# Patient Record
Sex: Female | Born: 1980 | Race: White | Hispanic: No | Marital: Married
Health system: Southern US, Community
[De-identification: ages and names within clinical notes are randomized; demographics above are authoritative.]

## PROBLEM LIST (undated history)

## (undated) DIAGNOSIS — F32A Depression, unspecified: Secondary | ICD-10-CM

## (undated) DIAGNOSIS — K219 Gastro-esophageal reflux disease without esophagitis: Secondary | ICD-10-CM

## (undated) DIAGNOSIS — T7840XA Allergy, unspecified, initial encounter: Secondary | ICD-10-CM

## (undated) DIAGNOSIS — F419 Anxiety disorder, unspecified: Secondary | ICD-10-CM

## (undated) DIAGNOSIS — D649 Anemia, unspecified: Secondary | ICD-10-CM

## (undated) DIAGNOSIS — M1990 Unspecified osteoarthritis, unspecified site: Secondary | ICD-10-CM

## (undated) HISTORY — DX: Depression, unspecified: F32.A

## (undated) HISTORY — DX: Allergy, unspecified, initial encounter: T78.40XA

## (undated) HISTORY — DX: Anxiety disorder, unspecified: F41.9

---

## 2001-11-19 ENCOUNTER — Encounter: Payer: Self-pay | Admitting: Orthopedic Surgery

## 2001-11-19 ENCOUNTER — Encounter: Admission: RE | Admit: 2001-11-19 | Discharge: 2001-11-19 | Payer: Self-pay | Admitting: Orthopedic Surgery

## 2004-08-06 ENCOUNTER — Emergency Department (HOSPITAL_COMMUNITY): Admission: EM | Admit: 2004-08-06 | Discharge: 2004-08-06 | Payer: Self-pay | Admitting: Family Medicine

## 2004-10-15 ENCOUNTER — Emergency Department (HOSPITAL_COMMUNITY): Admission: EM | Admit: 2004-10-15 | Discharge: 2004-10-15 | Payer: Self-pay | Admitting: Family Medicine

## 2004-12-24 ENCOUNTER — Emergency Department (HOSPITAL_COMMUNITY): Admission: EM | Admit: 2004-12-24 | Discharge: 2004-12-24 | Payer: Self-pay | Admitting: Family Medicine

## 2004-12-27 ENCOUNTER — Emergency Department (HOSPITAL_COMMUNITY): Admission: EM | Admit: 2004-12-27 | Discharge: 2004-12-27 | Payer: Self-pay | Admitting: Family Medicine

## 2005-03-20 ENCOUNTER — Emergency Department (HOSPITAL_COMMUNITY): Admission: EM | Admit: 2005-03-20 | Discharge: 2005-03-20 | Payer: Self-pay | Admitting: Emergency Medicine

## 2005-03-23 ENCOUNTER — Emergency Department (HOSPITAL_COMMUNITY): Admission: EM | Admit: 2005-03-23 | Discharge: 2005-03-23 | Payer: Self-pay | Admitting: Emergency Medicine

## 2005-03-28 ENCOUNTER — Emergency Department (HOSPITAL_COMMUNITY): Admission: EM | Admit: 2005-03-28 | Discharge: 2005-03-28 | Payer: Self-pay | Admitting: Emergency Medicine

## 2005-04-04 ENCOUNTER — Emergency Department (HOSPITAL_COMMUNITY): Admission: EM | Admit: 2005-04-04 | Discharge: 2005-04-04 | Payer: Self-pay | Admitting: Emergency Medicine

## 2005-04-18 ENCOUNTER — Emergency Department (HOSPITAL_COMMUNITY): Admission: EM | Admit: 2005-04-18 | Discharge: 2005-04-18 | Payer: Self-pay | Admitting: Emergency Medicine

## 2005-09-09 ENCOUNTER — Emergency Department (HOSPITAL_COMMUNITY): Admission: EM | Admit: 2005-09-09 | Discharge: 2005-09-09 | Payer: Self-pay | Admitting: Family Medicine

## 2005-10-24 ENCOUNTER — Emergency Department (HOSPITAL_COMMUNITY): Admission: EM | Admit: 2005-10-24 | Discharge: 2005-10-24 | Payer: Self-pay | Admitting: Emergency Medicine

## 2005-11-07 ENCOUNTER — Ambulatory Visit (HOSPITAL_COMMUNITY): Admission: RE | Admit: 2005-11-07 | Discharge: 2005-11-07 | Payer: Self-pay | Admitting: Urology

## 2006-01-25 ENCOUNTER — Emergency Department (HOSPITAL_COMMUNITY): Admission: EM | Admit: 2006-01-25 | Discharge: 2006-01-25 | Payer: Self-pay | Admitting: Emergency Medicine

## 2006-05-20 ENCOUNTER — Inpatient Hospital Stay (HOSPITAL_COMMUNITY): Admission: AD | Admit: 2006-05-20 | Discharge: 2006-05-20 | Payer: Self-pay | Admitting: Obstetrics and Gynecology

## 2006-06-20 ENCOUNTER — Ambulatory Visit (HOSPITAL_COMMUNITY): Admission: RE | Admit: 2006-06-20 | Discharge: 2006-06-20 | Payer: Self-pay | Admitting: Obstetrics

## 2006-11-20 ENCOUNTER — Inpatient Hospital Stay (HOSPITAL_COMMUNITY): Admission: AD | Admit: 2006-11-20 | Discharge: 2006-11-24 | Payer: Self-pay | Admitting: Obstetrics

## 2008-08-13 ENCOUNTER — Emergency Department: Payer: Self-pay | Admitting: Internal Medicine

## 2008-08-18 ENCOUNTER — Emergency Department: Payer: Self-pay | Admitting: Emergency Medicine

## 2008-08-20 ENCOUNTER — Inpatient Hospital Stay: Payer: Self-pay | Admitting: Internal Medicine

## 2009-09-26 ENCOUNTER — Emergency Department: Payer: Self-pay | Admitting: Emergency Medicine

## 2009-12-12 ENCOUNTER — Emergency Department: Payer: Self-pay | Admitting: Emergency Medicine

## 2010-08-13 ENCOUNTER — Emergency Department: Payer: Self-pay | Admitting: Emergency Medicine

## 2010-12-15 ENCOUNTER — Emergency Department: Payer: Self-pay | Admitting: Emergency Medicine

## 2010-12-24 ENCOUNTER — Emergency Department: Payer: Self-pay | Admitting: Internal Medicine

## 2010-12-25 ENCOUNTER — Emergency Department: Payer: Self-pay | Admitting: Emergency Medicine

## 2010-12-31 ENCOUNTER — Ambulatory Visit: Payer: Self-pay | Admitting: Urology

## 2011-01-03 ENCOUNTER — Emergency Department: Payer: Self-pay | Admitting: Internal Medicine

## 2011-05-21 ENCOUNTER — Emergency Department: Payer: Self-pay | Admitting: Emergency Medicine

## 2011-07-09 ENCOUNTER — Emergency Department: Payer: Self-pay | Admitting: Emergency Medicine

## 2012-01-22 ENCOUNTER — Emergency Department: Payer: Self-pay | Admitting: Emergency Medicine

## 2012-03-31 ENCOUNTER — Emergency Department: Payer: Self-pay | Admitting: Emergency Medicine

## 2012-03-31 LAB — URINALYSIS, COMPLETE
Bilirubin,UR: NEGATIVE
Glucose,UR: NEGATIVE mg/dL (ref 0–75)
Ketone: NEGATIVE
RBC,UR: 5 /HPF (ref 0–5)

## 2012-03-31 LAB — CBC
HGB: 13.1 g/dL (ref 12.0–16.0)
Platelet: 248 10*3/uL (ref 150–440)
RBC: 4.61 10*6/uL (ref 3.80–5.20)

## 2012-04-02 ENCOUNTER — Other Ambulatory Visit: Payer: Self-pay | Admitting: Obstetrics and Gynecology

## 2012-04-02 LAB — HCG, QUANTITATIVE, PREGNANCY: Beta Hcg, Quant.: 7 m[IU]/mL — ABNORMAL HIGH

## 2012-05-01 ENCOUNTER — Emergency Department: Payer: Self-pay | Admitting: Emergency Medicine

## 2012-05-01 LAB — URINALYSIS, COMPLETE
Bilirubin,UR: NEGATIVE
Blood: NEGATIVE
Glucose,UR: NEGATIVE mg/dL (ref 0–75)
Leukocyte Esterase: NEGATIVE
Squamous Epithelial: 8
WBC UR: 3 /HPF (ref 0–5)

## 2012-05-01 LAB — CBC
HCT: 40.4 % (ref 35.0–47.0)
Platelet: 255 10*3/uL (ref 150–440)
RDW: 13.1 % (ref 11.5–14.5)
WBC: 11.7 10*3/uL — ABNORMAL HIGH (ref 3.6–11.0)

## 2012-05-01 LAB — HCG, QUANTITATIVE, PREGNANCY: Beta Hcg, Quant.: 307 m[IU]/mL — ABNORMAL HIGH

## 2012-06-01 ENCOUNTER — Emergency Department: Payer: Self-pay | Admitting: Emergency Medicine

## 2012-06-01 ENCOUNTER — Ambulatory Visit: Payer: Self-pay | Admitting: Family Medicine

## 2012-06-01 LAB — BASIC METABOLIC PANEL
Anion Gap: 7 (ref 7–16)
BUN: 10 mg/dL (ref 7–18)
Chloride: 95 mmol/L — ABNORMAL LOW (ref 98–107)
Co2: 33 mmol/L — ABNORMAL HIGH (ref 21–32)
Creatinine: 0.6 mg/dL (ref 0.60–1.30)
Glucose: 131 mg/dL — ABNORMAL HIGH (ref 65–99)
Potassium: 3.3 mmol/L — ABNORMAL LOW (ref 3.5–5.1)

## 2012-06-01 LAB — CBC
MCH: 28.2 pg (ref 26.0–34.0)
MCHC: 32.9 g/dL (ref 32.0–36.0)
MCV: 86 fL (ref 80–100)
Platelet: 258 10*3/uL (ref 150–440)
WBC: 12.3 10*3/uL — ABNORMAL HIGH (ref 3.6–11.0)

## 2012-06-01 LAB — HCG, QUANTITATIVE, PREGNANCY: Beta Hcg, Quant.: 31724 m[IU]/mL — ABNORMAL HIGH

## 2012-07-02 ENCOUNTER — Encounter: Payer: Self-pay | Admitting: Obstetrics and Gynecology

## 2012-08-06 ENCOUNTER — Encounter: Payer: Self-pay | Admitting: Maternal and Fetal Medicine

## 2012-09-15 ENCOUNTER — Emergency Department: Payer: Self-pay | Admitting: Emergency Medicine

## 2012-09-16 LAB — COMPREHENSIVE METABOLIC PANEL
Anion Gap: 8 (ref 7–16)
Calcium, Total: 9.7 mg/dL (ref 8.5–10.1)
Chloride: 98 mmol/L (ref 98–107)
EGFR (African American): >60
Potassium: 3.5 mmol/L (ref 3.5–5.1)
SGOT(AST): 18 U/L (ref 15–37)

## 2012-09-16 LAB — URINALYSIS, COMPLETE
Glucose,UR: NEGATIVE mg/dL (ref 0–75)
Nitrite: NEGATIVE
RBC,UR: 32 /HPF (ref 0–5)
Squamous Epithelial: 7

## 2012-09-16 LAB — CBC
RBC: 4.46 10*6/uL (ref 3.80–5.20)
RDW: 13.5 % (ref 11.5–14.5)
WBC: 14.6 10*3/uL — ABNORMAL HIGH (ref 3.6–11.0)

## 2012-09-16 LAB — HCG, QUANTITATIVE, PREGNANCY: Beta Hcg, Quant.: 10824 m[IU]/mL — ABNORMAL HIGH

## 2012-09-20 LAB — URINE CULTURE

## 2013-01-06 ENCOUNTER — Inpatient Hospital Stay: Payer: Self-pay

## 2013-01-06 LAB — PIH PROFILE
Chloride: 101 mmol/L (ref 98–107)
Co2: 31 mmol/L (ref 21–32)
Creatinine: 0.64 mg/dL (ref 0.60–1.30)
Glucose: 107 mg/dL — ABNORMAL HIGH (ref 65–99)
HCT: 33.8 % — ABNORMAL LOW (ref 35.0–47.0)
MCH: 26.9 pg (ref 26.0–34.0)
MCV: 84 fL (ref 80–100)
Osmolality: 275 (ref 275–301)
SGOT(AST): 20 U/L (ref 15–37)
Uric Acid: 4 mg/dL (ref 2.6–6.0)
WBC: 12 10*3/uL — ABNORMAL HIGH (ref 3.6–11.0)

## 2013-03-18 ENCOUNTER — Emergency Department: Payer: Self-pay | Admitting: Emergency Medicine

## 2013-09-21 ENCOUNTER — Emergency Department: Payer: Self-pay | Admitting: Emergency Medicine

## 2013-10-30 ENCOUNTER — Emergency Department: Payer: Self-pay | Admitting: Emergency Medicine

## 2013-10-30 LAB — RAPID INFLUENZA A&B ANTIGENS

## 2013-11-02 ENCOUNTER — Emergency Department: Payer: Self-pay | Admitting: Emergency Medicine

## 2013-11-02 LAB — URINALYSIS, COMPLETE
Bacteria: NONE SEEN
Glucose,UR: NEGATIVE mg/dL (ref 0–75)
Nitrite: NEGATIVE
Ph: 6 (ref 4.5–8.0)
SPECIFIC GRAVITY: 1.03 (ref 1.003–1.030)

## 2013-11-02 LAB — PREGNANCY, URINE: PREGNANCY TEST, URINE: NEGATIVE m[IU]/mL

## 2014-05-16 ENCOUNTER — Emergency Department: Payer: Self-pay | Admitting: Emergency Medicine

## 2014-05-16 LAB — COMPREHENSIVE METABOLIC PANEL
ALK PHOS: 88 U/L
ALT: 26 U/L (ref 12–78)
AST: 19 U/L (ref 15–37)
Albumin: 3.4 g/dL (ref 3.4–5.0)
Anion Gap: 5 — ABNORMAL LOW (ref 7–16)
BILIRUBIN TOTAL: 0.5 mg/dL (ref 0.2–1.0)
BUN: 10 mg/dL (ref 7–18)
CHLORIDE: 99 mmol/L (ref 98–107)
CREATININE: 0.64 mg/dL (ref 0.60–1.30)
Calcium, Total: 8.5 mg/dL (ref 8.5–10.1)
Co2: 34 mmol/L — ABNORMAL HIGH (ref 21–32)
EGFR (African American): >60
GLUCOSE: 85 mg/dL (ref 65–99)
Osmolality: 274 (ref 275–301)
Potassium: 3.7 mmol/L (ref 3.5–5.1)
SODIUM: 138 mmol/L (ref 136–145)
TOTAL PROTEIN: 7.8 g/dL (ref 6.4–8.2)

## 2014-05-16 LAB — URINALYSIS, COMPLETE
Bilirubin,UR: NEGATIVE
Blood: NEGATIVE
Glucose,UR: NEGATIVE mg/dL (ref 0–75)
Ketone: NEGATIVE
Leukocyte Esterase: NEGATIVE
Nitrite: NEGATIVE
Ph: 7 (ref 4.5–8.0)
Protein: 30
RBC,UR: 3 /HPF (ref 0–5)
Specific Gravity: 1.026 (ref 1.003–1.030)
Squamous Epithelial: 8
WBC UR: 5 /HPF (ref 0–5)

## 2014-05-16 LAB — CBC
HCT: 39.8 % (ref 35.0–47.0)
HGB: 12.7 g/dL (ref 12.0–16.0)
MCH: 26.9 pg (ref 26.0–34.0)
MCHC: 32 g/dL (ref 32.0–36.0)
MCV: 84 fL (ref 80–100)
PLATELETS: 238 10*3/uL (ref 150–440)
RBC: 4.73 10*6/uL (ref 3.80–5.20)
RDW: 14 % (ref 11.5–14.5)
WBC: 9.4 10*3/uL (ref 3.6–11.0)

## 2014-05-16 LAB — LIPASE, BLOOD: LIPASE: 99 U/L (ref 73–393)

## 2014-06-18 ENCOUNTER — Emergency Department: Payer: Self-pay | Admitting: Emergency Medicine

## 2014-08-18 ENCOUNTER — Emergency Department: Payer: Self-pay | Admitting: Emergency Medicine

## 2014-12-21 ENCOUNTER — Emergency Department: Payer: Self-pay | Admitting: Emergency Medicine

## 2015-02-14 NOTE — Consult Note (Signed)
Referral Information:   Reason for Referral elevated BP and h/o hypertension with last delivery    Referring Physician [facility] - Shapley-Quinn    Prenatal Hx 34 yo G3 p1011 at 66 w 6d with EDC of 3/14 by 8 3/7 w scan done at [facility]  -early bleeding now resolved -elevated BP a t [facility] 130/92 130/88, pt ntoes she had elevated BP after last dleivery that she relates to her mother inlaw  -h/o LGA infant  - h/o UTI, nephrolithiasis -varicella equivocal    Past Obstetrical Hx 2008 - 40 w female 9lb 10 oz - episiotomy no problems [facility] [place] elevated BP pp that pt attributes to family stress  03/2012 - early SAB 6 weeks   Home Medications: Medication Instructions Status  Prenatal Multivitamins oral tablet 1 tab(s) orally once a day Active   Allergies:   Eggs: Hives  Vital Signs/Notes:  Nursing Vital Signs: **Vital Signs.:   05-Sep-13 08:33   Vital Signs Type Routine   Temperature Temperature (F) 99.1   Celsius 37.2   Temperature Source oral   Pulse Pulse 88   Pulse source if not from Vital Sign Device dinamap   Respirations Respirations 14   Systolic BP Systolic BP 112   Diastolic BP (mmHg) Diastolic BP (mmHg) 71   Mean BP 84   BP Source  if not from Vital Sign Device dinamap   Perinatal Consult:   LMP 29-Mar-2012    PMed Hx Rubella Immune, varicella equivocal    Past Medical History cont'd - h/o UTI -h/o nephrolithiasis  - occ elevated BP no meds , no preeclampsia with alst delivery though elevated BP noted pp  - elevated BMI occurred with last pregnancy gained 50-60 lbs BMI  - husband smokes recently quit to be able to afford a new motorcycle    FHx metabolic disorders either side, fam h/o diabetes    Soc Hx married   Review Of Systems:   Tolerating Diet Yes    Medications/Allergies Reviewed Medications/Allergies reviewed   Exam:   Today's Weight 224   Impression/Recommendations:   Impression 1IUP at 12w 6d  Normal first tri scan today genetic  counsleing declines screening for aneuploidy, bleeding resolved  2sporadically increased BP that pt attributes to stress - normal today  3varicella equivocal 4h/o LGA infant -family h/o diabetes - GCT done last visit at [facility] no results yet  5h/o UTI and nephrolithiasis  6 Elevated BMI    Recommendations 1. Anatomy scan ordered for 18 weeks 2 encouraged limiting weight gain to 10-15 pounds  3 agree with early GCT repeat 28 weeks 4 consider third tri scan to evaluate EFW  5 q tri UC&S  6 varicella vaccine pp   Plan:   Genetic Counseling done - note to follow    Prenatal Diagnosis Options declined all but u/s    Ultrasound at what gestational ages 30 and 39 weeks    Delivery Mode Vaginal    Additional Testing p/c ratio     Total Time Spent with Patient 30 minutes    >50% of visit spent in couseling/coordination of care yes    Office Use Only [address]  Level 2 ( ) NEW office consult exp prob focused   Coding Description: MATERNAL CONDITIONS/HISTORY INDICATION(S).   Chronic HTN.   History of prior preg w/ Macrosomia.  Electronic Signatures: Rondall Allegra (MD)  (Signed 05-Sep-13 12:01)  Authored: Referral, Home Medications, Allergies, Vital Signs/Notes, Consult, Exam, Impression, Plan, Billing, Coding Description   Last  Updated: 05-Sep-13 12:01 by Rondall AllegraLivingston, Lavonia Eager [place] (MD)

## 2015-02-17 NOTE — H&P (Signed)
PATIENT NAME:  Karen Harrington, Karen Harrington MR#:  [id number] DATE OF BIRTH:  10-08-1981  DATE OF ADMISSION:  01/06/2013  DIAGNOSES:  1.  A 39-1/5 week intrauterine pregnancy.  2.  Mild pregnancy-induced hypertension.   HISTORY OF PRESENT ILLNESS:  The patient is a 34 year old white female gravida 2, para 1-0-0-1, EDC 01/08/2013, presents at 39.[redacted] weeks gestation for induction of labor secondary to pregnancy-induced hypertension.  The patient has been having as part of testing with reassuring NSTs.  The patient reports good fetal movement.  She denies preeclampsia symptoms.   PAST MEDICAL HISTORY: 1.  Increased body mass index.  2.  Renal lithiasis.  3.  Pregnancy-induced hypertension.   PAST SURGICAL HISTORY:  Lithotripsy.   PAST OBSTETRICAL HISTORY:  Para 1-0-0-1, spontaneous vaginal delivery x 1 with delivery of a 9 pound 10 ounce baby.   FAMILY HISTORY:  Diabetes mellitus in mother and sister.  No history of colon, ovary or breast cancer.  There are no genetic disorders in the family.   SOCIAL HISTORY:  The patient is a previous smoker; quit 4 years ago.  Alcohol use negative.  Drug use negative.   DRUG ALLERGIES:  None.   CURRENT MEDICATIONS:  Prenatal vitamins, Tylenol.   REVIEW OF SYSTEMS:  The patient denies preeclampsia symptoms.  She reports good fetal movement.   PHYSICAL EXAMINATION:  VITAL SIGNS:  Weight 238, blood pressure 115/88, heart rate 96, BMI 37.3.  GENERAL:  The patient is a pleasant well-appearing gravid female in no acute distress.  She is alert and oriented.  NECK:  Supple without thyromegaly or adenopathy.  LUNGS:  Clear.  HEART:  Regular rate and rhythm without murmur.  ABDOMEN:  Soft, gravid, fundal height 36 cm, vertex by Thayer OhmLeopold.  Estimated fetal weight 8 pounds, 8 ounces.  Fetal heart tones are normal at 145.  Cervix is to 70%, minus 2, vertex, bag of water intact.  EXTREMITIES:  1+ edema.  Deep tendon reflexes 2+.   IMAGING STUDIES:  Ultrasound today, AFI 8.5 cm.   BPP is 8 out of 8.   ASSESSMENT:   1.  A 39.5 week intrauterine pregnancy, undelivered.  2.  Mild pregnancy-induced hypertension.   PLAN:  1.  Cytotec/Pitocin induction of labor.  2.  Pregnancy-induced hypertension panel.  3.  Epidural as needed.  4.  Anticipate vaginal delivery.     ____________________________ Prentice DockerMartin A. DeFrancesco, MD mad:ea D: 01/06/2013 22:47:05 ET T: 01/06/2013 23:02:19 ET JOB#: [id number]  cc: Daphine DeutscherMartin A. DeFrancesco, MD, <Dictator> [facility] Prentice DockerMARTIN A DEFRANCESCO MD ELECTRONICALLY SIGNED 01/10/2013 17:40

## 2015-05-27 ENCOUNTER — Emergency Department
Admission: EM | Admit: 2015-05-27 | Discharge: 2015-05-27 | Disposition: A | Discharge status: Home / Self Care | Payer: Medicaid Other | Attending: Emergency Medicine | Admitting: Emergency Medicine

## 2015-05-27 ENCOUNTER — Emergency Department: Payer: Self-pay

## 2015-05-27 ENCOUNTER — Encounter: Payer: Self-pay | Admitting: Emergency Medicine

## 2015-05-27 DIAGNOSIS — Z87891 Personal history of nicotine dependence: Secondary | ICD-10-CM | POA: Insufficient documentation

## 2015-05-27 DIAGNOSIS — L309 Dermatitis, unspecified: Secondary | ICD-10-CM

## 2015-05-27 DIAGNOSIS — M722 Plantar fascial fibromatosis: Secondary | ICD-10-CM

## 2015-05-27 MED ORDER — ETODOLAC 400 MG PO TABS: 400.0000 mg | ORAL_TABLET | Freq: Two times a day (BID) | ORAL | Status: DC

## 2015-05-27 MED ORDER — CLINDAMYCIN HCL 150 MG PO CAPS: 300.0000 mg | ORAL_CAPSULE | Freq: Once | ORAL | Status: DC

## 2015-05-27 NOTE — ED Provider Notes (Signed)
[facility] Emergency Department Provider Note  ____________________________________________  Time seen:  1:06 PM  I have reviewed the triage vital signs and the nursing notes.   HISTORY  Chief Complaint Foot Pain   HPI Karen Harrington is a 34 y.o. female is here today with complaint of bilateral feet pain for greater then several weeks. She states she wakes up with pain that increases when she initially gets up in the mornings. This pain continues throughout the day. She has been taking Tylenol and ibuprofen sparingly without any relief of her pain. She denies any previous foot pain or injury. She also has a rash to her right lower leg that she was checked. She states rash has been there for "a while" and doesn't itch or burn. She states it feels dry and bumpy. Currently her foot pain is 10 over 10.   History reviewed. No pertinent past medical history.  There are no active problems to display for this patient.   History reviewed. No pertinent past surgical history.  Current Outpatient Rx  Name  Route  Sig  Dispense  Refill  . etodolac (LODINE) 400 MG tablet   Oral   Take 1 tablet (400 mg total) by mouth 2 (two) times daily.   20 tablet   0     Allergies Review of patient's allergies indicates no known allergies.  No family history on file.  Social History History  Substance Use Topics  . Smoking status: Former Games developer  . Smokeless tobacco: Not on file  . Alcohol Use: No    Review of Systems Constitutional: No fever/chills ENT: No sore throat. Cardiovascular: Denies chest pain. Respiratory: Denies shortness of breath. Gastrointestinal: No abdominal pain.  No nausea, no vomiting. Genitourinary: Negative for dysuria. Musculoskeletal: Negative for back pain. Skin: Positive for rash. Neurological: Negative for headaches, focal weakness or numbness.  10-point ROS otherwise  negative.  ____________________________________________   PHYSICAL EXAM:  VITAL SIGNS: ED Triage Vitals  Enc Vitals Group     BP 05/27/15 1230 111/70 mmHg     Pulse Rate 05/27/15 1230 91     Resp 05/27/15 1230 18     Temp 05/27/15 1230 98.5 F (36.9 C)     Temp Source 05/27/15 1230 Oral     SpO2 05/27/15 1230 96 %     Weight 05/27/15 1224 245 lb (111.131 kg)     Height 05/27/15 1224  (1.676 m)     Head Cir --      Peak Flow --      Pain Score 05/27/15 1224 10     Pain Loc --      Pain Edu? --      Excl. in GC? --     Constitutional: Alert and oriented. Well appearing and in no acute distress. Eyes: Conjunctivae are normal. PERRL. EOMI. Head: Atraumatic. Nose: No congestion/rhinnorhea. Neck: No stridor.   Cardiovascular: Normal rate, regular rhythm. Grossly normal heart sounds.  Good peripheral circulation. Respiratory: Normal respiratory effort.  No retractions. Lungs CTAB. Gastrointestinal: Soft and nontender. No distention Musculoskeletal: Bilateral feet without deformity. Patient has poor bilateral arches. There is marked tenderness on palpation plantar aspect of the left heel and arch of the foot. There is plantar pain in the right foot as well. Digits move without difficulty. Motor sensory function intact. Neurologic:  Normal speech and language. No gross focal neurologic deficits are appreciated. No gait instability. Skin: Skin is warm. Skin bilateral feet there is no erythema or  edema noted. Right lower leg there is an area approximately 4 cm x 3 cm that is extremely dry and scaly. There is some erythema to this area. There is no drainage present. Patient also has similar area resenting on the left leg as well. Psychiatric: Mood and affect are normal. Speech and behavior are normal.  ____________________________________________   LABS (all labs ordered are listed, but only abnormal results are displayed)  Labs Reviewed - No data to display  RADIOLOGY  Left  foot x-ray per radiologist shows small plantar calcaneal spur. I, Tommi Rumps, personally viewed and evaluated these images as part of my medical decision making.  ____________________________________________   PROCEDURES  Procedure(s) performed: None  Critical Care performed: No  ____________________________________________   INITIAL IMPRESSION / ASSESSMENT AND PLAN / ED COURSE  Pertinent labs & imaging results that were available during my care of the patient were reviewed by me and considered in my medical decision making (see chart for details  patient was given the name of a podiatrist to follow up for her bilateral foot pain. She is also told about orthotics for the inside of her shoes. She was started on etodolac for pain since ibuprofen does not seem to be helping. She was told her rash most likely is eczema and that she can try over-the-counter cortisone cream.  FINAL CLINICAL IMPRESSION(S) / ED DIAGNOSES  Final diagnoses:  Plantar fasciitis, bilateral  Eczema      Tommi Rumps, PA-C 05/27/15 1703  Emily Filbert, MD 05/28/15 1500

## 2015-05-27 NOTE — ED Notes (Signed)
Pt unable to sign at this time, signed paper copy via RN shannon, placed on chart.

## 2015-05-27 NOTE — Discharge Instructions (Signed)
Plantar Fasciitis °Plantar fasciitis is a common condition that causes foot pain. It is soreness (inflammation) of the band of tough fibrous tissue on the bottom of the foot that runs from the heel bone (calcaneus) to the ball of the foot. The cause of this soreness may be from excessive standing, poor fitting shoes, running on hard surfaces, being overweight, having an abnormal walk, or overuse (this is common in runners) of the painful foot or feet. It is also common in aerobic exercise dancers and ballet dancers. °SYMPTOMS  °Most people with plantar fasciitis complain of: °· Severe pain in the morning on the bottom of their foot especially when taking the first steps out of bed. This pain recedes after a few minutes of walking. °· Severe pain is experienced also during walking following a long period of inactivity. °· Pain is worse when walking barefoot or up stairs °DIAGNOSIS  °· Your caregiver will diagnose this condition by examining and feeling your foot. °· Special tests such as X-rays of your foot, are usually not needed. °PREVENTION  °· Consult a sports medicine professional before beginning a new exercise program. °· Walking programs offer a good workout. With walking there is a lower chance of overuse injuries common to runners. There is less impact and less jarring of the joints. °· Begin all new exercise programs slowly. If problems or pain develop, decrease the amount of time or distance until you are at a comfortable level. °· Wear good shoes and replace them regularly. °· Stretch your foot and the heel cords at the back of the ankle (Achilles tendon) both before and after exercise. °· Run or exercise on even surfaces that are not hard. For example, asphalt is better than pavement. °· Do not run barefoot on hard surfaces. °· If using a treadmill, vary the incline. °· Do not continue to workout if you have foot or joint problems. Seek professional help if they do not improve. °HOME CARE INSTRUCTIONS    °· Avoid activities that cause you pain until you recover. °· Use ice or cold packs on the problem or painful areas after working out. °· Only take over-the-counter or prescription medicines for pain, discomfort, or fever as directed by your caregiver. °· Soft shoe inserts or athletic shoes with air or gel sole cushions may be helpful. °· If problems continue or become more severe, consult a sports medicine caregiver or your own health care provider. Cortisone is a potent anti-inflammatory medication that may be injected into the painful area. You can discuss this treatment with your caregiver. °MAKE SURE YOU:  °· Understand these instructions. °· Will watch your condition. °· Will get help right away if you are not doing well or get worse. °Document Released: 07/09/2001 Document Revised: 01/06/2012 Document Reviewed: 09/07/2008 °ExitCare® Patient Information ©2015 [employer]. This information is not intended to replace advice given to you by your health care provider. Make sure you discuss any questions you have with your health care provider. ° °Heel Spur °A heel spur is a hook of bone that can form on the calcaneus (the heel bone and the largest bone of the foot). Heel spurs are often associated with plantar fasciitis and usually come in people who have had the problem for an extended period of time. The cause of the relationship is unknown. The pain associated with them is thought to be caused by an inflammation (soreness and redness) of the plantar fascia rather than the spur itself. The plantar fascia is a thick   fibrous like tissue that runs from the calcaneus (heel bone) to the ball of the foot. This strong, tight tissue helps maintain the arch of your foot. It helps distribute the weight across your foot as you walk or run. Stresses placed on the plantar fascia can be tremendous. When it is inflamed normal activities become painful. Pain is worse in the morning after sleeping. After sleeping the plantar  fascia is tight. The first movements stretch the fascia and this causes pain. As the tendon loosens, the pain usually gets better. It often returns with too much standing or walking.  °About 70% of patients with plantar fasciitis have a heel spur. About half of people without foot pain also have heel spurs. °DIAGNOSIS  °The diagnosis of a heel spur is made by X-ray. The X-ray shows a hook of bone protruding from the bottom of the calcaneus at the point where the plantar fascia is attached to the heel bone.  °TREATMENT °· It is necessary to find out what is causing the stretching of the plantar fascia. If the cause is over-pronation (flat feet), orthotics and proper foot ware may help. °· Stretching exercises, losing weight, wearing shoes that have a cushioned heel that absorbs shock, and elevating the heel with the use of a heel cradle, heel cup, or orthotics may all help. Heel cradles and heel cups provide extra comfort and cushion to the heel, and reduce the amount of shock to the sore area. °AVOIDING THE PAIN OF PLANTAR FASCIITIS AND HEEL SPURS °· Consult a sports medicine professional before beginning a new exercise program. °· Walking programs offer a good workout. There is a lower chance of overuse injuries common to the runners. There is less impact and less jarring of the joints. °· Begin all new exercise programs slowly. If problems or pains develop, decrease the amount of time or distance until you are at a comfortable level. °· Wear good shoes and replace them regularly. °· Stretch your foot and the heel cords at the back of the ankle (Achilles tendons) both before and after exercise. °· Run or exercise on even surfaces that are not hard. For example, asphalt is better than pavement. °· Do not run barefoot on hard surfaces. °· If using a treadmill, vary the incline. °· Do not continue to workout if you have foot or joint problems. Seek professional help if they do not improve. °HOME CARE INSTRUCTIONS   °· Avoid activities that cause you pain until you recover. °· Use ice or cold packs to the problem or painful areas after working out. °· Only take over-the-counter or prescription medicines for pain, discomfort, or fever as directed by your caregiver. °· Soft shoe inserts or athletic shoes with air or gel sole cushions may be helpful. °· If problems continue or become more severe, consult a sports medicine caregiver. Cortisone is a potent anti-inflammatory medication that may be injected into the painful area. You can discuss this treatment with your caregiver. °MAKE SURE YOU:  °· Understand these instructions. °· Will watch your condition. °· Will get help right away if you are not doing well or get worse. °Document Released: 11/20/2005 Document Revised: 01/06/2012 Document Reviewed: 12/15/2013 °ExitCare® Patient Information ©2015 [employer]. This information is not intended to replace advice given to you by your health care provider. Make sure you discuss any questions you have with your health care provider. ° °

## 2015-05-27 NOTE — ED Notes (Signed)
Pt states she has been having horrible pain in bilateral feet for about a week now, states they hurt all over. Also has a rash to her right lower leg.

## 2015-06-05 ENCOUNTER — Emergency Department
Admission: EM | Admit: 2015-06-05 | Discharge: 2015-06-05 | Disposition: A | Discharge status: Home / Self Care | Payer: Self-pay | Attending: Emergency Medicine | Admitting: Emergency Medicine

## 2015-06-05 ENCOUNTER — Encounter: Payer: Self-pay | Admitting: Emergency Medicine

## 2015-06-05 ENCOUNTER — Emergency Department: Payer: Medicaid Other

## 2015-06-05 DIAGNOSIS — Z87891 Personal history of nicotine dependence: Secondary | ICD-10-CM | POA: Insufficient documentation

## 2015-06-05 DIAGNOSIS — Z79899 Other long term (current) drug therapy: Secondary | ICD-10-CM | POA: Insufficient documentation

## 2015-06-05 DIAGNOSIS — Z3202 Encounter for pregnancy test, result negative: Secondary | ICD-10-CM | POA: Insufficient documentation

## 2015-06-05 DIAGNOSIS — K529 Noninfective gastroenteritis and colitis, unspecified: Secondary | ICD-10-CM | POA: Insufficient documentation

## 2015-06-05 LAB — COMPREHENSIVE METABOLIC PANEL
ALBUMIN: 4.1 g/dL (ref 3.5–5.0)
ALK PHOS: 87 U/L (ref 38–126)
ALT: 21 U/L (ref 14–54)
ANION GAP: 9 (ref 5–15)
AST: 22 U/L (ref 15–41)
BILIRUBIN TOTAL: 0.7 mg/dL (ref 0.3–1.2)
BUN: 17 mg/dL (ref 6–20)
CALCIUM: 9.2 mg/dL (ref 8.9–10.3)
CO2: 35 mmol/L — AB (ref 22–32)
Chloride: 95 mmol/L — ABNORMAL LOW (ref 101–111)
Creatinine, Ser: 0.64 mg/dL (ref 0.44–1.00)
Glucose, Bld: 102 mg/dL — ABNORMAL HIGH (ref 65–99)
POTASSIUM: 4 mmol/L (ref 3.5–5.1)
SODIUM: 139 mmol/L (ref 135–145)
Total Protein: 8.1 g/dL (ref 6.5–8.1)

## 2015-06-05 LAB — URINALYSIS COMPLETE WITH MICROSCOPIC (ARMC ONLY)
Bilirubin Urine: NEGATIVE
GLUCOSE, UA: NEGATIVE mg/dL
Ketones, ur: NEGATIVE mg/dL
Nitrite: NEGATIVE
PH: 7 (ref 5.0–8.0)
PROTEIN: NEGATIVE mg/dL
Specific Gravity, Urine: 1.024 (ref 1.005–1.030)

## 2015-06-05 LAB — CBC
HCT: 41.3 % (ref 35.0–47.0)
HEMOGLOBIN: 13.8 g/dL (ref 12.0–16.0)
MCH: 28.1 pg (ref 26.0–34.0)
MCHC: 33.3 g/dL (ref 32.0–36.0)
MCV: 84.4 fL (ref 80.0–100.0)
PLATELETS: 229 10*3/uL (ref 150–440)
RBC: 4.89 MIL/uL (ref 3.80–5.20)
RDW: 14 % (ref 11.5–14.5)
WBC: 14.1 10*3/uL — AB (ref 3.6–11.0)

## 2015-06-05 LAB — PREGNANCY, URINE: PREG TEST UR: NEGATIVE

## 2015-06-05 MED ORDER — IOHEXOL 300 MG/ML  SOLN
125.0000 mL | Freq: Once | INTRAMUSCULAR | Status: AC | PRN
  Administered 2015-06-05: 125 mL via INTRAVENOUS

## 2015-06-05 MED ORDER — MORPHINE SULFATE 4 MG/ML IJ SOLN
4.0000 mg | Freq: Once | INTRAMUSCULAR | Status: AC
  Administered 2015-06-05: 4 mg via INTRAVENOUS

## 2015-06-05 MED ORDER — ONDANSETRON HCL 4 MG/2ML IJ SOLN
4.0000 mg | Freq: Once | INTRAMUSCULAR | Status: AC
  Administered 2015-06-05: 4 mg via INTRAVENOUS
  Filled 2015-06-05: qty 2

## 2015-06-05 MED ORDER — IOHEXOL 240 MG/ML SOLN
25.0000 mL | Freq: Once | INTRAMUSCULAR | Status: AC | PRN
  Administered 2015-06-05: 25 mL via ORAL

## 2015-06-05 MED ORDER — ONDANSETRON 4 MG PO TBDP: 4.0000 mg | ORAL_TABLET | Freq: Three times a day (TID) | ORAL | Status: DC | PRN

## 2015-06-05 MED ORDER — SODIUM CHLORIDE 0.9 % IV BOLUS (SEPSIS)
1000.0000 mL | Freq: Once | INTRAVENOUS | Status: AC
  Administered 2015-06-05: 1000 mL via INTRAVENOUS

## 2015-06-05 MED ORDER — MORPHINE SULFATE 4 MG/ML IJ SOLN
4.0000 mg | Freq: Once | INTRAMUSCULAR | Status: AC
  Administered 2015-06-05: 4 mg via INTRAVENOUS
  Filled 2015-06-05: qty 1

## 2015-06-05 MED ORDER — MORPHINE SULFATE 4 MG/ML IJ SOLN
INTRAMUSCULAR | Status: AC
  Filled 2015-06-05: qty 1

## 2015-06-05 MED ORDER — TRAMADOL HCL 50 MG PO TABS: 50.0000 mg | ORAL_TABLET | Freq: Four times a day (QID) | ORAL | Status: AC | PRN

## 2015-06-05 NOTE — ED Notes (Signed)
Patient transported to CT 

## 2015-06-05 NOTE — ED Notes (Signed)
Pt reports waking up this morning at 02:45 with a sharp pain in her lower mid abdomen, followed by diarrhea multiple times.  Pt reports then having vomiting.  Pt continues to have abdominal pain.

## 2015-06-05 NOTE — ED Notes (Signed)
Patient returned from CT

## 2015-06-05 NOTE — ED Provider Notes (Signed)
[facility] Emergency Department Provider Note  Time seen: 8:14 AM  I have reviewed the triage vital signs and the nursing notes.   HISTORY  Chief Complaint Diarrhea and Emesis    HPI Karen Harrington is a 34 y.o. female with no past medical history who presents the emergency department with diarrhea, nausea, vomiting and abdominal pain. According to the patient she woke from her sleep around 2:30 AM with lower abdominal pain, nausea, vomiting, diarrhea. Patient states she has continued to feel nauseated with intermittent episodes of vomiting. She has continued with frequent loose bowel movements. Denies any blood or black. Denies fever. Describes her lower abdominal pain as cramping, 7/10 currently. No modifying factor identified. Patient has not attempted to eat today.    History reviewed. No pertinent past medical history.  There are no active problems to display for this patient.   History reviewed. No pertinent past surgical history.  Current Outpatient Rx  Name  Route  Sig  Dispense  Refill  . etodolac (LODINE) 400 MG tablet   Oral   Take 1 tablet (400 mg total) by mouth 2 (two) times daily.   20 tablet   0     Allergies Review of patient's allergies indicates no known allergies.  No family history on file.  Social History History  Substance Use Topics  . Smoking status: Former Smoker    Quit date: 10/28/2004  . Smokeless tobacco: Not on file  . Alcohol Use: No    Review of Systems Constitutional: Negative for fever. Cardiovascular: Negative for chest pain. Respiratory: Negative for shortness of breath. Gastrointestinal: Positive for lower abdominal pain, nausea, vomiting, diarrhea. Genitourinary: Negative for dysuria. Negative for vaginal bleeding or discharge. Musculoskeletal: Negative for back pain. 10-point ROS otherwise negative.  ____________________________________________   PHYSICAL EXAM:  VITAL SIGNS: ED Triage Vitals   Enc Vitals Group     BP 06/05/15 0731 128/83 mmHg     Pulse Rate 06/05/15 0731 98     Resp 06/05/15 0731 18     Temp 06/05/15 0731 98.1 F (36.7 C)     Temp Source 06/05/15 0731 Oral     SpO2 06/05/15 0731 99 %     Weight 06/05/15 0731 240 lb (108.863 kg)     Height 06/05/15 0731 5\' 6"  (1.676 m)     Head Cir --      Peak Flow --      Pain Score 06/05/15 0732 9     Pain Loc --      Pain Edu? --      Excl. in GC? --     Constitutional: Alert and oriented. Well appearing and in no distress. Eyes: Normal exam ENT   Mouth/Throat: Dry mucous membranes. Cardiovascular: Normal rate, regular rhythm. No murmur Respiratory: Normal respiratory effort without tachypnea nor retractions. Breath sounds are clear and equal bilaterally. No wheezes/rales/rhonchi. Gastrointestinal: Soft, moderate lower abdominal tenderness. No distention. No rebound or guarding. Musculoskeletal: Nontender with normal range of motion in all extremities.  Neurologic:  Normal speech and language. No gross focal neurologic deficits  Skin:  Skin is warm, dry and intact.  Psychiatric: Mood and affect are normal. Speech and behavior are normal.  ____________________________________________     INITIAL IMPRESSION / ASSESSMENT AND PLAN / ED COURSE  Pertinent labs & imaging results that were available during my care of the patient were reviewed by me and considered in my medical decision making (see chart for details).  Patient with acute  onset of nausea, vomiting, diarrhea, cramping lower abdominal pain. We will check labs, IV hydrate, treat nausea and pain while awaiting laboratory results. Patient does have moderate lower abdominal tenderness, otherwise fairly normal exam. Her abdominal tenderness does not appear to be focal in any specific location.  CT within normal limits. We will discharge the patient home on Zofran as needed. I discussed strict return precautions to which she is agreeable. Likely  gastroenteritis.  ____________________________________________   FINAL CLINICAL IMPRESSION(S) / ED DIAGNOSES  Lower abdominal pain Nausea and vomiting Diarrhea Gastroenteritis   Minna Antis, MD 06/05/15 1308

## 2015-06-05 NOTE — Discharge Instructions (Signed)

## 2016-01-09 ENCOUNTER — Emergency Department: Payer: Medicaid Other

## 2016-01-09 ENCOUNTER — Encounter: Payer: Self-pay | Admitting: Emergency Medicine

## 2016-01-09 ENCOUNTER — Emergency Department
Admission: EM | Admit: 2016-01-09 | Discharge: 2016-01-09 | Disposition: A | Discharge status: Home / Self Care | Payer: Self-pay | Attending: Emergency Medicine | Admitting: Emergency Medicine

## 2016-01-09 DIAGNOSIS — J219 Acute bronchiolitis, unspecified: Secondary | ICD-10-CM | POA: Insufficient documentation

## 2016-01-09 DIAGNOSIS — Z79899 Other long term (current) drug therapy: Secondary | ICD-10-CM | POA: Insufficient documentation

## 2016-01-09 DIAGNOSIS — B9789 Other viral agents as the cause of diseases classified elsewhere: Secondary | ICD-10-CM

## 2016-01-09 DIAGNOSIS — B349 Viral infection, unspecified: Secondary | ICD-10-CM | POA: Insufficient documentation

## 2016-01-09 DIAGNOSIS — J218 Acute bronchiolitis due to other specified organisms: Secondary | ICD-10-CM

## 2016-01-09 DIAGNOSIS — Z87891 Personal history of nicotine dependence: Secondary | ICD-10-CM | POA: Insufficient documentation

## 2016-01-09 LAB — RAPID INFLUENZA A&B ANTIGENS
Influenza A (ARMC): NEGATIVE
Influenza B (ARMC): NEGATIVE

## 2016-01-09 MED ORDER — LORATADINE 10 MG PO TABS: 10.0000 mg | ORAL_TABLET | Freq: Every day | ORAL | Status: DC

## 2016-01-09 MED ORDER — PROMETHAZINE-DM 6.25-15 MG/5ML PO SYRP: 5.0000 mL | ORAL_SOLUTION | Freq: Four times a day (QID) | ORAL | Status: DC | PRN

## 2016-01-09 MED ORDER — ALBUTEROL SULFATE HFA 108 (90 BASE) MCG/ACT IN AERS: 1.0000 | INHALATION_SPRAY | Freq: Four times a day (QID) | RESPIRATORY_TRACT | Status: DC | PRN

## 2016-01-09 NOTE — ED Notes (Signed)
Pt to ed with c/o cough, congestion, sore throat, headache x 1 day.  Denies fever, denies earache.  Pt in no acute resp distress at this time.

## 2016-01-09 NOTE — ED Notes (Signed)
Presents with cough with some fever since yesterday

## 2016-01-09 NOTE — ED Provider Notes (Signed)
[facility] Emergency Department Provider Note  ____________________________________________  Time seen: Approximately 10:35 AM  I have reviewed the triage vital signs and the nursing notes.   HISTORY  Chief Complaint Cough    HPI Karen Harrington is a 35 y.o. female , NAD, presents to the emergency department with sudden onset cough, chest congestion, sore throat and body aches. Noted fatigue this morning. Had some nausea and vomiting last night. No known sick contacts. Has not taken anything over-the-counter for current symptoms. Has had no abdominal pain, chest pain, back pain. Is able to eat and drink well.    History reviewed. No pertinent past medical history.  There are no active problems to display for this patient.   History reviewed. No pertinent past surgical history.  Current Outpatient Rx  Name  Route  Sig  Dispense  Refill  . albuterol (PROVENTIL HFA;VENTOLIN HFA) 108 (90 Base) MCG/ACT inhaler   Inhalation   Inhale 1-2 puffs into the lungs every 6 (six) hours as needed for wheezing or shortness of breath.   1 Inhaler   0   . etodolac (LODINE) 400 MG tablet   Oral   Take 1 tablet (400 mg total) by mouth 2 (two) times daily.   20 tablet   0   . loratadine (CLARITIN) 10 MG tablet   Oral   Take 1 tablet (10 mg total) by mouth daily.   30 tablet   0   . ondansetron (ZOFRAN ODT) 4 MG disintegrating tablet   Oral   Take 1 tablet (4 mg total) by mouth every 8 (eight) hours as needed for nausea or vomiting.   20 tablet   0   . promethazine-dextromethorphan (PROMETHAZINE-DM) 6.25-15 MG/5ML syrup   Oral   Take 5 mLs by mouth 4 (four) times daily as needed for cough.   118 mL   0   . traMADol (ULTRAM) 50 MG tablet   Oral   Take 1 tablet (50 mg total) by mouth every 6 (six) hours as needed.   20 tablet   0     Allergies Review of patient's allergies indicates no known allergies.  No family history on file.  Social  History Social History  Substance Use Topics  . Smoking status: Former Smoker    Quit date: 10/28/2004  . Smokeless tobacco: None  . Alcohol Use: No     Review of Systems  Constitutional: Positive fatigue. No fever/chills.  Eyes: No visual changes. No discharge ENT: Positive nasal congestion, sore throat. No ear pain, sinus pressure.  Cardiovascular: No chest pain. Respiratory: Positive chest congestion, cough. No shortness of breath. No wheezing.  Gastrointestinal: Positive nausea, vomiting. No abdominal pain.   No diarrhea, constipation. Genitourinary: Negative for dysuria,hematuria. No urinary hesitancy, urgency or increased frequency. Musculoskeletal: Negative for back pain.  Skin: Negative for rash. Neurological: Negative for headaches, focal weakness or numbness. 10-point ROS otherwise negative.  ____________________________________________   PHYSICAL EXAM:  VITAL SIGNS: ED Triage Vitals  Enc Vitals Group     BP 01/09/16 0930 130/82 mmHg     Pulse Rate 01/09/16 0930 80     Resp 01/09/16 0930 15     Temp 01/09/16 0930 98.6 F (37 C)     Temp Source 01/09/16 0930 Oral     SpO2 01/09/16 0930 96 %     Weight 01/09/16 0930 250 lb (113.399 kg)     Height 01/09/16 0930  (1.727 m)     Head Cir --  Peak Flow --      Pain Score 01/09/16 0940 8     Pain Loc --      Pain Edu? --      Excl. in GC? --     Constitutional: Alert and oriented. Well appearing and in no acute distress. Eyes: Conjunctivae are normal. PERRL. EOMI without pain.  Head: Atraumatic. ENT:      Ears: TMs visualized without erythema, dozing, effusion, perforation.      Nose: No congestion/rhinnorhea.      Mouth/Throat: Mucous membranes are moist. Neck supple without erythema, exudate, swelling. Neck: No focal spine tenderness to palpation. Supple with full range of motion. Hematological/Lymphatic/Immunilogical: No cervical lymphadenopathy. Cardiovascular: Normal rate, regular rhythm.  Normal S1 and S2.  Good peripheral circulation. Respiratory: Normal respiratory effort without tachypnea or retractions. Lungs CTAB. Neurologic:  Normal speech and language. No gross focal neurologic deficits are appreciated.  Skin:  Skin is warm, dry and intact. No rash noted. Psychiatric: Mood and affect are normal. Speech and behavior are normal. Patient exhibits appropriate insight and judgement.   ____________________________________________   LABS (all labs ordered are listed, but only abnormal results are displayed)  Labs Reviewed  RAPID INFLUENZA A&B ANTIGENS ([facility] ONLY)   ____________________________________________  EKG  None ____________________________________________  RADIOLOGY I have personally viewed and evaluated these images (plain radiographs) as part of my medical decision making, as well as reviewing the written report by the radiologist.  Dg Chest 2 View  01/09/2016  CLINICAL DATA:  Cough and fever since yesterday. EXAM: CHEST  2 VIEW COMPARISON:  Chest x-ray 06/18/2014 FINDINGS: The cardiac silhouette, mediastinal and hilar contours are within normal limits and stable. The lungs demonstrate chronic bronchitic interstitial scarring changes. No acute overlying pulmonary process such as infiltrate, edema or effusion. The bony thorax is intact. IMPRESSION: Chronic bronchitic type interstitial scarring lung changes but no acute pulmonary findings. Electronically Signed   By: Rudie MeyerP.  Gallerani M.D.   On: 01/09/2016 10:15    ____________________________________________    PROCEDURES  Procedure(s) performed: None    Medications - No data to display   ____________________________________________   INITIAL IMPRESSION / ASSESSMENT AND PLAN / ED COURSE  Pertinent labs & imaging results that were available during my care of the patient were reviewed by me and considered in my medical decision making (see chart for details).  Patient's diagnosis is consistent  with viral upper respiratory infection with bronchitis. Patient will be discharged home with prescriptions for promethazine DM, albuterol inhaler, and loratadine to take as directed. [facility] if symptoms persist past this treatment course. Patient is given ED precautions to return to the ED for any worsening or new symptoms.    ____________________________________________  FINAL CLINICAL IMPRESSION(S) / ED DIAGNOSES  Final diagnoses:  Viral infection  Acute viral bronchiolitis      NEW MEDICATIONS STARTED DURING THIS VISIT:  New Prescriptions   ALBUTEROL (PROVENTIL HFA;VENTOLIN HFA) 108 (90 BASE) MCG/ACT INHALER    Inhale 1-2 puffs into the lungs every 6 (six) hours as needed for wheezing or shortness of breath.   LORATADINE (CLARITIN) 10 MG TABLET    Take 1 tablet (10 mg total) by mouth daily.   PROMETHAZINE-DEXTROMETHORPHAN (PROMETHAZINE-DM) 6.25-15 MG/5ML SYRUP    Take 5 mLs by mouth 4 (four) times daily as needed for cough.         Hope PigeonJami L Hagler, PA-C 01/09/16 1105  Jene Everyobert Kinner, MD 01/09/16 1215

## 2016-05-21 ENCOUNTER — Emergency Department
Admission: EM | Admit: 2016-05-21 | Discharge: 2016-05-21 | Disposition: A | Discharge status: Home / Self Care | Payer: Medicaid Other | Attending: Emergency Medicine | Admitting: Emergency Medicine

## 2016-05-21 ENCOUNTER — Encounter: Payer: Self-pay | Admitting: Emergency Medicine

## 2016-05-21 DIAGNOSIS — K922 Gastrointestinal hemorrhage, unspecified: Secondary | ICD-10-CM

## 2016-05-21 DIAGNOSIS — K649 Unspecified hemorrhoids: Secondary | ICD-10-CM | POA: Insufficient documentation

## 2016-05-21 DIAGNOSIS — Z79899 Other long term (current) drug therapy: Secondary | ICD-10-CM | POA: Insufficient documentation

## 2016-05-21 DIAGNOSIS — Z87891 Personal history of nicotine dependence: Secondary | ICD-10-CM | POA: Insufficient documentation

## 2016-05-21 LAB — CBC
HEMATOCRIT: 39.2 % (ref 35.0–47.0)
HEMOGLOBIN: 13 g/dL (ref 12.0–16.0)
MCH: 27.5 pg (ref 26.0–34.0)
MCHC: 33.1 g/dL (ref 32.0–36.0)
MCV: 82.8 fL (ref 80.0–100.0)
Platelets: 249 10*3/uL (ref 150–440)
RBC: 4.73 MIL/uL (ref 3.80–5.20)
RDW: 13.7 % (ref 11.5–14.5)
WBC: 11.7 10*3/uL — ABNORMAL HIGH (ref 3.6–11.0)

## 2016-05-21 LAB — COMPREHENSIVE METABOLIC PANEL
ALBUMIN: 4 g/dL (ref 3.5–5.0)
ALT: 20 U/L (ref 14–54)
ANION GAP: 7 (ref 5–15)
AST: 21 U/L (ref 15–41)
Alkaline Phosphatase: 75 U/L (ref 38–126)
BUN: 15 mg/dL (ref 6–20)
CO2: 34 mmol/L — ABNORMAL HIGH (ref 22–32)
Calcium: 9.2 mg/dL (ref 8.9–10.3)
Chloride: 96 mmol/L — ABNORMAL LOW (ref 101–111)
Creatinine, Ser: 0.51 mg/dL (ref 0.44–1.00)
GFR calc Af Amer: >60 mL/min (ref >60)
GFR calc non Af Amer: >60 mL/min (ref >60)
GLUCOSE: 95 mg/dL (ref 65–99)
POTASSIUM: 3.5 mmol/L (ref 3.5–5.1)
SODIUM: 137 mmol/L (ref 135–145)
Total Bilirubin: 0.5 mg/dL (ref 0.3–1.2)
Total Protein: 7.9 g/dL (ref 6.5–8.1)

## 2016-05-21 MED ORDER — PSYLLIUM 28 % PO PACK: 1.0000 | PACK | Freq: Two times a day (BID) | ORAL | 0 refills | Status: DC | PRN

## 2016-05-21 MED ORDER — HYDROCORTISONE 1 % EX CREA: 1.0000 "application " | TOPICAL_CREAM | Freq: Two times a day (BID) | CUTANEOUS | 0 refills | Status: DC

## 2016-05-21 NOTE — ED Triage Notes (Addendum)
Blood when she has bowel movement since last night. Also headache

## 2016-05-21 NOTE — ED Provider Notes (Signed)
[facility] Emergency Department Provider Note   ____________________________________________  Time seen: Approximately 845 PM  I have reviewed the triage vital signs and the nursing notes.   HISTORY  Chief Complaint Rectal Bleeding   HPI Karen Harrington is a 35 y.o. female without any chronic medical conditions was presenting to the emergency department today with 3 episodes of bright red blood per rectum. She says that she had to push hard to move her bowels today and had bright red blood in the toilet. She shows me a picture on her cell phone of bright red blood without clots. She denies any history of diverticulosis. Denies any known history of hemorrhoids. She says that she is having pain around her anus but denies any abdominal pain at this time. Said that she also had mild nausea and a headache. She also has a history of headaches.Patient says that despite feeling nauseous she was able to eat Cheerios and drink a Coke earlier today. York Spaniel that she also has had a bowel movement since the bleeding bowel movements which was [place]upinacci and without any visible blood.   History reviewed. No pertinent past medical history.  There are no active problems to display for this patient.   History reviewed. No pertinent surgical history.  Current Outpatient Rx  . Order #: [id number] Class: Print  . Order #: [id number] Class: Print  . Order #: [id number] Class: Print  . Order #: [id number] Class: Print  . Order #: [id number] Class: Print  . Order #: [id number] Class: Print    Allergies Review of patient's allergies indicates no known allergies.  No family history on file.  Social History Social History  Substance Use Topics  . Smoking status: Former Smoker    Quit date: 10/28/2004  . Smokeless tobacco: Never Used  . Alcohol use No    Review of Systems Constitutional: No fever/chills Eyes: No visual changes. ENT: No sore throat. Cardiovascular: Denies chest  pain. Respiratory: Denies shortness of breath. Gastrointestinal: No abdominal pain.  no vomiting.  No diarrhea.  No constipation. Genitourinary: Negative for dysuria. Musculoskeletal: Negative for back pain. Skin: Negative for rash. Neurological: Negative for focal weakness or numbness.  10-point ROS otherwise negative.  ____________________________________________   PHYSICAL EXAM:  VITAL SIGNS: ED Triage Vitals  Enc Vitals Group     BP 05/21/16 1524 108/80     Pulse Rate 05/21/16 1524 89     Resp 05/21/16 1524 14     Temp 05/21/16 1524 98.3 F (36.8 C)     Temp Source 05/21/16 1524 Oral     SpO2 05/21/16 1524 95 %     Weight 05/21/16 1524 229 lb (103.9 kg)     Height 05/21/16 1524 5\' 7"  (1.702 m)     Head Circumference --      Peak Flow --      Pain Score 05/21/16 1525 6     Pain Loc --      Pain Edu? --      Excl. in GC? --     Constitutional: Alert and oriented. Well appearing and in no acute distress. Eyes: Conjunctivae are normal. PERRL. EOMI. Head: Atraumatic. Nose: No congestion/rhinnorhea. Mouth/Throat: Mucous membranes are moist.   Neck: No stridor.   Cardiovascular: Normal rate, regular rhythm. Grossly normal heart sounds.   Respiratory: Normal respiratory effort.  No retractions. Lungs CTAB. Gastrointestinal: Soft and nontender. No distention. Small, non-engorged hemorrhoid at 6:00. Digital rectal exam with grossly Tison stool which is weakly heme positive. Musculoskeletal: No lower  extremity tenderness nor edema.  No joint effusions. Neurologic:  Normal speech and language. No gross focal neurologic deficits are appreciated. No gait instability. Skin:  Skin is warm, dry and intact. No rash noted. Psychiatric: Mood and affect are normal. Speech and behavior are normal.  ____________________________________________   LABS (all labs ordered are listed, but only abnormal results are displayed)  Labs Reviewed  COMPREHENSIVE METABOLIC PANEL - Abnormal;  Notable for the following:       Result Value   Chloride 96 (*)    CO2 34 (*)    All other components within normal limits  CBC - Abnormal; Notable for the following:    WBC 11.7 (*)    All other components within normal limits   ____________________________________________  EKG   ____________________________________________  RADIOLOGY   ____________________________________________   PROCEDURES   Procedures   ____________________________________________   INITIAL IMPRESSION / ASSESSMENT AND PLAN / ED COURSE  Pertinent labs & imaging results that were available during my care of the patient were reviewed by me and considered in my medical decision making (see chart for details).  Patient with history and physical consistent with hemorrhoids. Very low risk for other causes of bleeding such diverticulosis. Appears to have a chronic leukocytosis. She'll be discharged to home with Preparation H as well as Metamucil to be used as needed. She says that she does not want to use a stool softener regularly because she sometimes has diarrhea. I'll also give her follow-up with surgeon. She understands the plan and is willing to comply. She knows to return for any worsening or concerning symptoms.  Clinical Course     ____________________________________________   FINAL CLINICAL IMPRESSION(S) / ED DIAGNOSES  Non-engorged hemorrhoids. Anorectal hemorrhage.    NEW MEDICATIONS STARTED DURING THIS VISIT:  New Prescriptions   No medications on file     Note:  This document was prepared using Dragon voice recognition software and may include unintentional dictation errors.    Myrna Blazer, MD 05/21/16 2107

## 2016-05-21 NOTE — ED Notes (Signed)
MD at bedside. 

## 2016-08-12 IMAGING — CT CT ABD-PELV W/ CM
2 of 4 series · 17 of 46 positions shown, 19 images · IV contrast (omnipaque)
Comparison: 12/26/2010 and 01/25/2006

CLINICAL DATA: Abdominal pain with diarrhea and vomiting.

EXAM:
CT ABDOMEN AND PELVIS WITH CONTRAST
TECHNIQUE: Multidetector CT imaging of the abdomen and pelvis was performed
using the standard protocol following bolus administration of
intravenous contrast.
CONTRAST:  125mL OMNIPAQUE IOHEXOL 300 MG/ML  SOLN

[Series 2: routine abd pel with · axial · 0.82mm/px · z∈[-530,-80]mm · 14 of 100 slices shown, 16 images]
[im 5/100  soft-tissue]
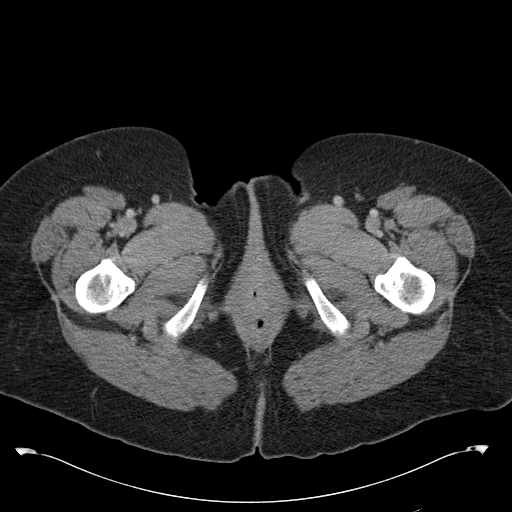
[im 5/100  bone]
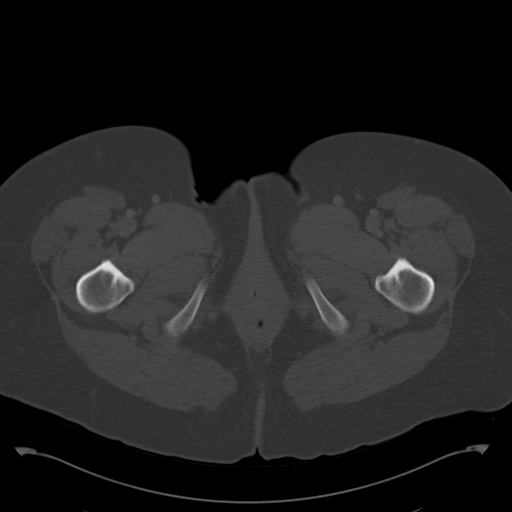
[im 13/100  soft-tissue]
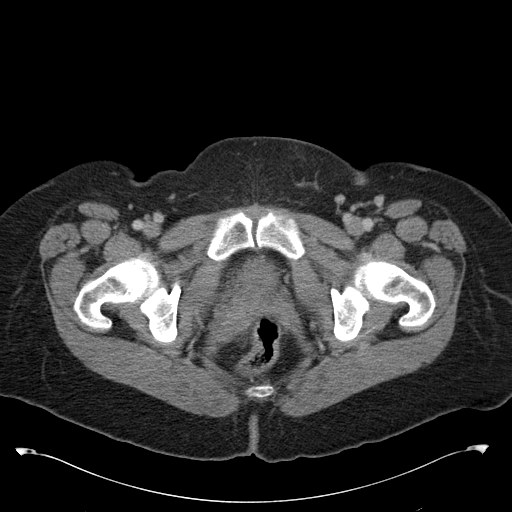
[im 21/100  soft-tissue]
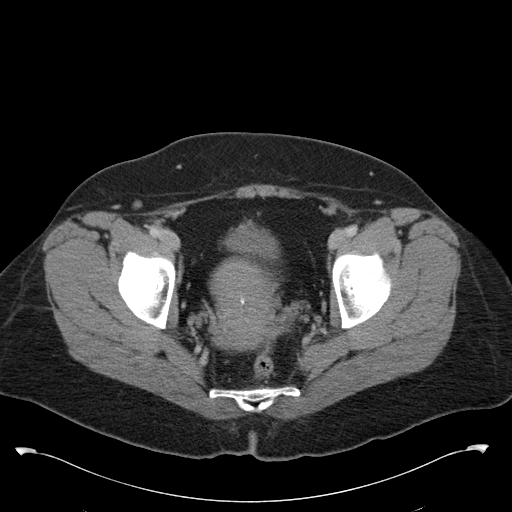
[im 25/100  soft-tissue]
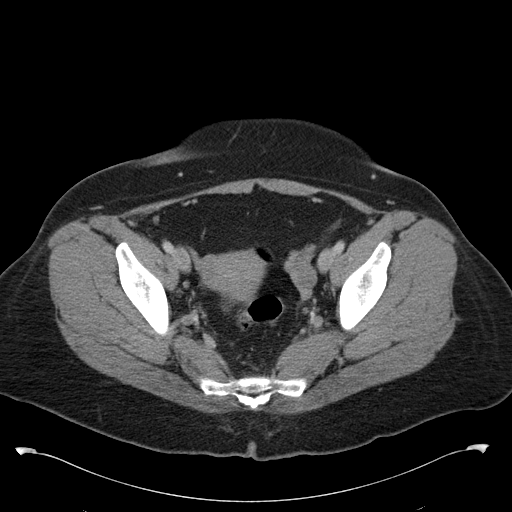
[im 34/100  soft-tissue]
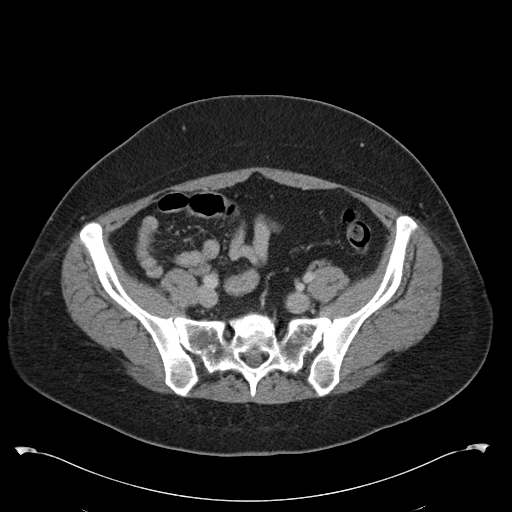
[im 42/100  soft-tissue]
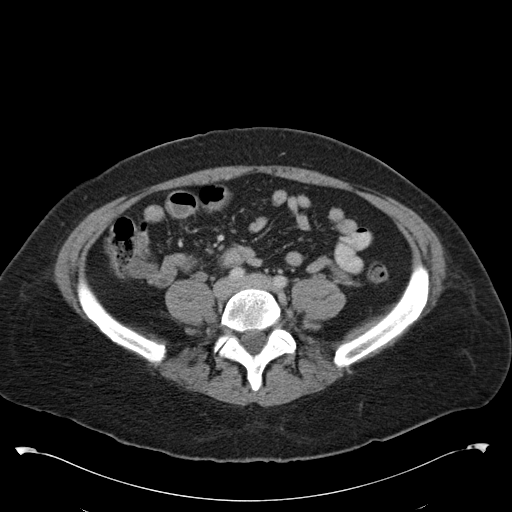
[im 46/100  soft-tissue]
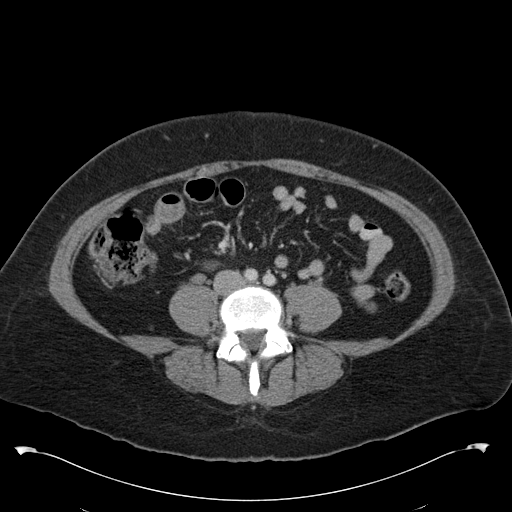
[im 54/100  soft-tissue]
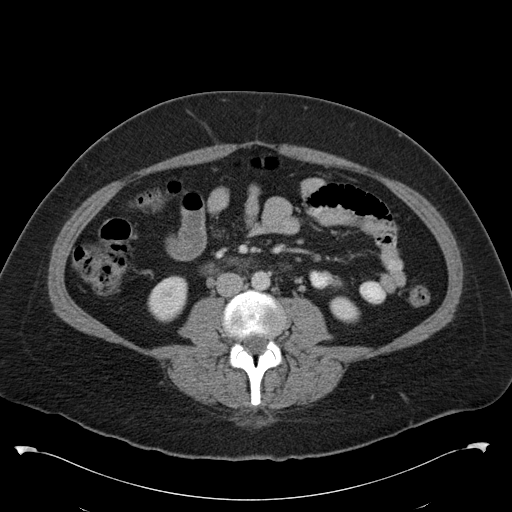
[im 58/100  soft-tissue]
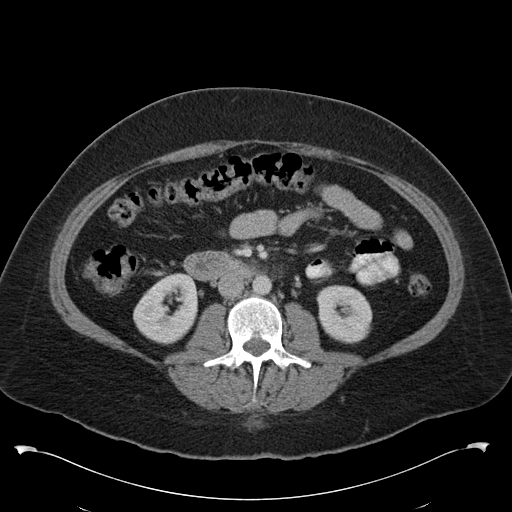
[im 58/100  bone]
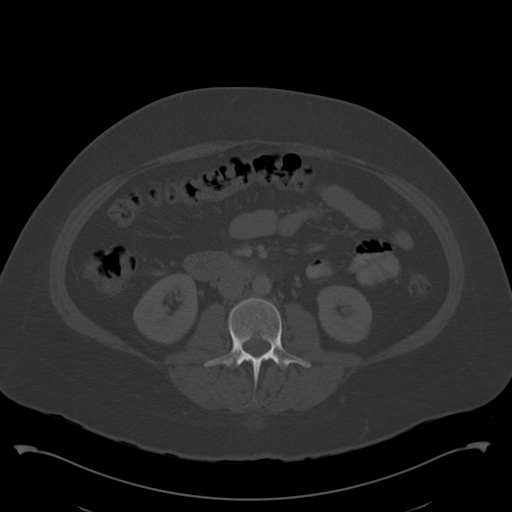
[im 67/100  soft-tissue]
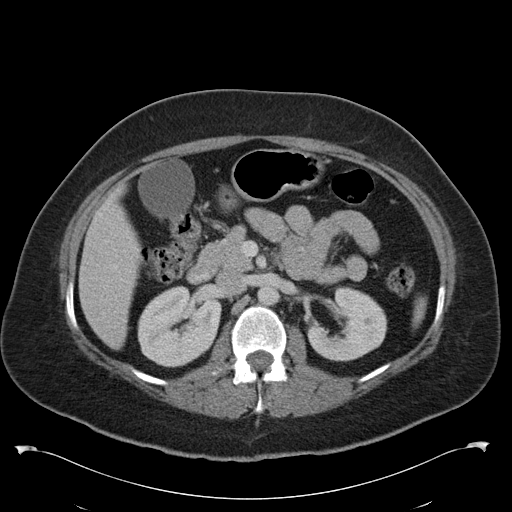
[im 75/100  soft-tissue]
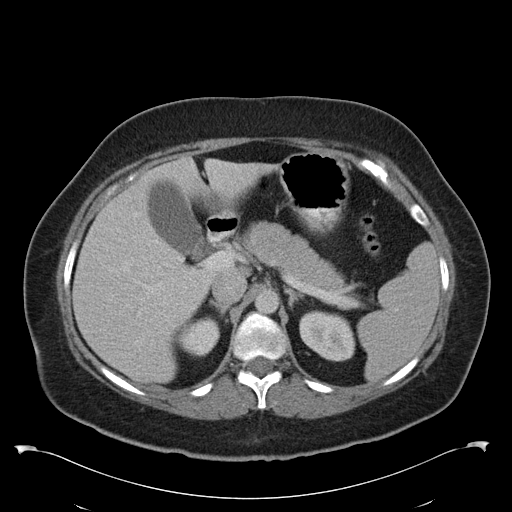
[im 79/100  soft-tissue]
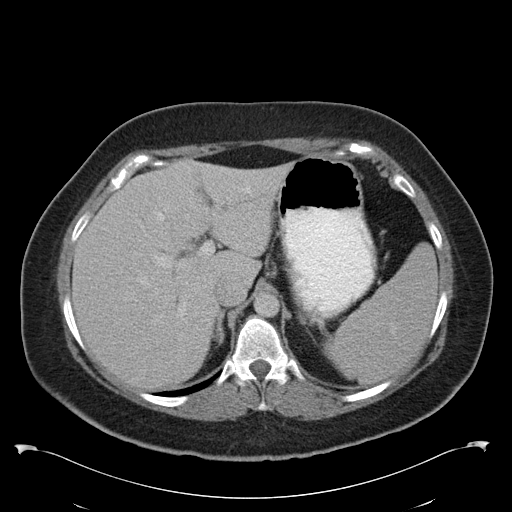
[im 87/100  soft-tissue]
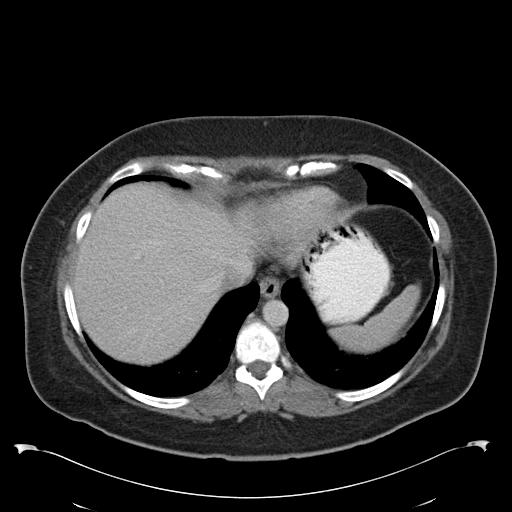
[im 95/100  soft-tissue]
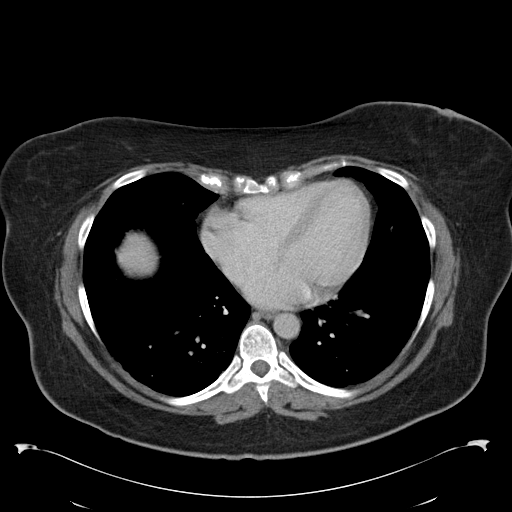

[Series 5: cor routine abd pel with · coronal · 0.81mm/px · 3 of 124 slices shown]
[im 42/124  soft-tissue]
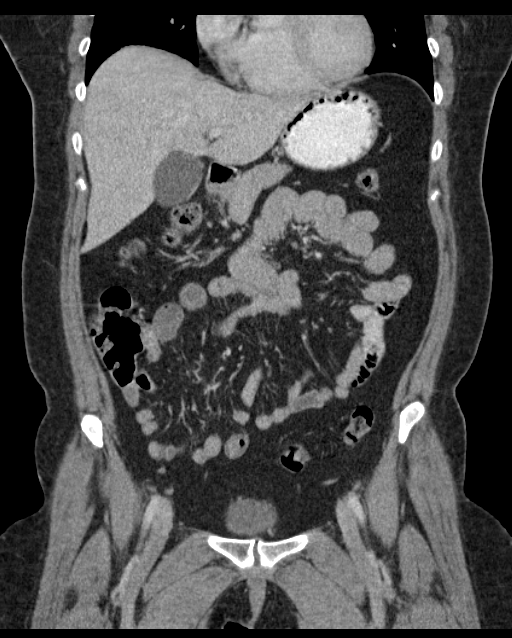
[im 55/124  soft-tissue]
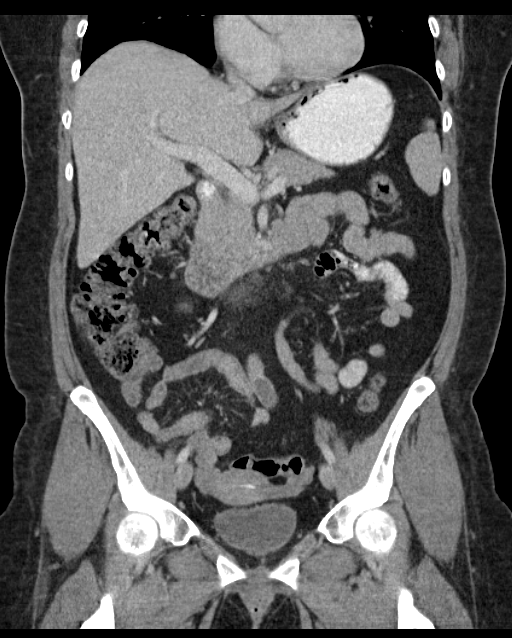
[im 69/124  soft-tissue]
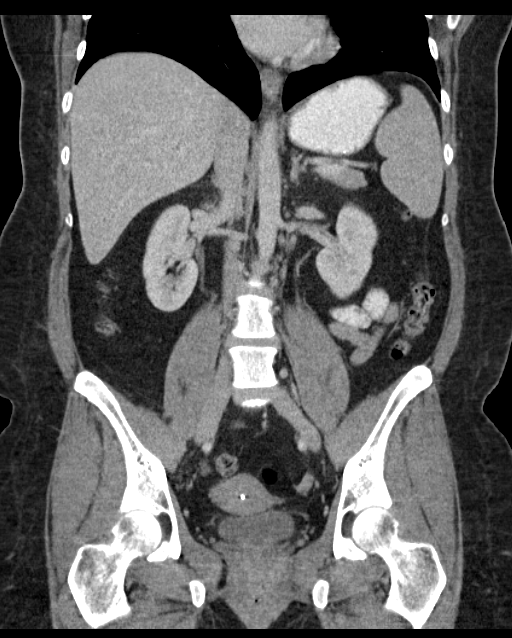

[17 of 46 positions shown; findings below may reference images not displayed]

FINDINGS: Lower chest: There are multiple small areas of tree-in-bud faint
infiltrates at both lung bases. This is nonspecific but has been
associated with mycobacterial and prior all infections as well as
aspiration and diseases such as rheumatoid arthritis. The findings
are new since 8878.

Hepatobiliary: Normal.

Pancreas: Normal.

Spleen: Normal.

Adrenals/Urinary Tract: Normal except for a 6 mm cyst in the mid
left kidney.

Stomach/Bowel: Normal.

Vascular/Lymphatic: Normal.

Reproductive: IUD in place.  Otherwise normal.

Other: No free air or free fluid.

Musculoskeletal: Normal.
IMPRESSION: Benign appearing abdomen.

## 2016-10-04 ENCOUNTER — Emergency Department
Admission: EM | Admit: 2016-10-04 | Discharge: 2016-10-04 | Disposition: A | Discharge status: Home / Self Care | Payer: Medicaid Other | Attending: Emergency Medicine | Admitting: Emergency Medicine

## 2016-10-04 ENCOUNTER — Encounter: Payer: Self-pay | Admitting: Emergency Medicine

## 2016-10-04 DIAGNOSIS — Z79899 Other long term (current) drug therapy: Secondary | ICD-10-CM | POA: Insufficient documentation

## 2016-10-04 DIAGNOSIS — Z87891 Personal history of nicotine dependence: Secondary | ICD-10-CM | POA: Insufficient documentation

## 2016-10-04 DIAGNOSIS — B349 Viral infection, unspecified: Secondary | ICD-10-CM | POA: Insufficient documentation

## 2016-10-04 MED ORDER — ONDANSETRON 4 MG PO TBDP
4.0000 mg | ORAL_TABLET | Freq: Once | ORAL | Status: AC
  Administered 2016-10-04: 4 mg via ORAL
  Filled 2016-10-04: qty 1

## 2016-10-04 MED ORDER — ONDANSETRON 4 MG PO TBDP: 4.0000 mg | ORAL_TABLET | Freq: Three times a day (TID) | ORAL | 0 refills | Status: DC | PRN

## 2016-10-04 MED ORDER — BENZONATATE 100 MG PO CAPS: 200.0000 mg | ORAL_CAPSULE | Freq: Three times a day (TID) | ORAL | 0 refills | Status: DC | PRN

## 2016-10-04 NOTE — ED Triage Notes (Signed)
Sore throat started yesterday.  No flu like sx

## 2016-10-04 NOTE — ED Provider Notes (Signed)
[facility] Emergency Department Provider Note   ____________________________________________   First MD Initiated Contact with Patient 10/04/16 [phone]     (approximate)  I have reviewed the triage vital signs and the nursing notes.   HISTORY  Chief Complaint Influenza   HPI Philipp DeputyJamie M Losano is a 35 y.o. female is here with complaint of sore throat and cough that started yesterday along with vomiting. Patient states that she has felt like she had a fever but has not actually taken her temperature. She denies any ear pain, no coughing, no muscle aches or diarrhea. Patient has not taken any medication over-the-counter for her cough. Patient states that her cough is intermittent. In talking with her it sounds as if her symptoms came on gradually rather than sudden. Currently she rates her discomfort as an 8 out of 10.   History reviewed. No pertinent past medical history.  There are no active problems to display for this patient.   History reviewed. No pertinent surgical history.  Prior to Admission medications   Medication Sig Start Date End Date Taking? Authorizing Provider  albuterol (PROVENTIL HFA;VENTOLIN HFA) 108 (90 Base) MCG/ACT inhaler Inhale 1-2 puffs into the lungs every 6 (six) hours as needed for wheezing or shortness of breath. 01/09/16   Jami L Hagler, PA-C  benzonatate (TESSALON PERLES) 100 MG capsule Take 2 capsules (200 mg total) by mouth 3 (three) times daily as needed for cough. 10/04/16 10/04/17  Tommi Rumpshonda L Kayin Kettering, PA-C  hydrocortisone cream (PREPARATION H HYDROCORTISONE) 1 % Apply 1 application topically 2 (two) times daily. 05/21/16   Myrna Blazeravid Matthew Schaevitz, MD  ondansetron (ZOFRAN ODT) 4 MG disintegrating tablet Take 1 tablet (4 mg total) by mouth every 8 (eight) hours as needed for nausea or vomiting. 10/04/16   Tommi Rumpshonda L Patrici Minnis, PA-C    Allergies Patient has no known allergies.  No family history on file.  Social History Social History   Substance Use Topics  . Smoking status: Former Smoker    Quit date: 10/28/2004  . Smokeless tobacco: Never Used  . Alcohol use No    Review of Systems Constitutional: Subjective fever/chills Eyes: No visual changes. ENT: Positive sore throat. Cardiovascular: Denies chest pain. Respiratory: Denies shortness of breath. Positive cough Gastrointestinal: No abdominal pain.  Positive nausea, positive vomiting.  No diarrhea.  No constipation. Genitourinary: Negative for dysuria. Musculoskeletal: Negative for back pain. Skin: Negative for rash. Neurological: Negative for headaches, focal weakness or numbness.  10-point ROS otherwise negative.  ____________________________________________   PHYSICAL EXAM:  VITAL SIGNS: ED Triage Vitals  Enc Vitals Group     BP 10/04/16 0749 127/78     Pulse Rate 10/04/16 0749 95     Resp 10/04/16 0749 20     Temp 10/04/16 0749 98.6 F (37 C)     Temp Source 10/04/16 0749 Oral     SpO2 10/04/16 0749 95 %     Weight 10/04/16 0750 238 lb (108 kg)     Height 10/04/16 0750 5\' 7"  (1.702 m)     Head Circumference --      Peak Flow --      Pain Score 10/04/16 0740 8     Pain Loc --      Pain Edu? --      Excl. in GC? --     Constitutional: Alert and oriented. Well appearing and in no acute distress. Eyes: Conjunctivae are normal. PERRL. EOMI. Head: Atraumatic. Nose: No congestion/rhinnorhea.  EACs and TMs are  clear bilaterally. Mouth/Throat: Mucous membranes are moist.  Oropharynx non-erythematous.No erythema or exudate seen. Neck: No stridor.   Hematological/Lymphatic/Immunilogical: No cervical lymphadenopathy. Cardiovascular: Normal rate, regular rhythm. Grossly normal heart sounds.  Good peripheral circulation. Respiratory: Normal respiratory effort.  No retractions. Lungs CTAB. Gastrointestinal: Soft and nontender. No distention. Bowel sounds normoactive 4 quadrants. Musculoskeletal: Moves upper and lower extremities without any  difficulty. Normal gait was noted. Neurologic:  Normal speech and language. No gross focal neurologic deficits are appreciated. No gait instability. Skin:  Skin is warm, dry and intact. No rash noted. Psychiatric: Mood and affect are normal. Speech and behavior are normal.  ____________________________________________   LABS (all labs ordered are listed, but only abnormal results are displayed)  Labs Reviewed - No data to display  PROCEDURES  Procedure(s) performed: None  Procedures  Critical Care performed: No  ____________________________________________   INITIAL IMPRESSION / ASSESSMENT AND PLAN / ED COURSE  Pertinent labs & imaging results that were available during my care of the patient were reviewed by me and considered in my medical decision making (see chart for details).    Clinical Course    Patient was given Zofran while in the emergency room for nausea. She is encouraged to drink lots of fluids. She is given a prescription for Zofran as needed for nausea and Tessalon Perles as needed for coughing. She is encouraged to take Tylenol or ibuprofen if needed for fever or body aches. [facility] any [facility] problems.  ____________________________________________   FINAL CLINICAL IMPRESSION(S) / ED DIAGNOSES  Final diagnoses:  Viral illness      NEW MEDICATIONS STARTED DURING THIS VISIT:  New Prescriptions   BENZONATATE (TESSALON PERLES) 100 MG CAPSULE    Take 2 capsules (200 mg total) by mouth 3 (three) times daily as needed for cough.   ONDANSETRON (ZOFRAN ODT) 4 MG DISINTEGRATING TABLET    Take 1 tablet (4 mg total) by mouth every 8 (eight) hours as needed for nausea or vomiting.     Note:  This document was prepared using Dragon voice recognition software and may include unintentional dictation errors.    Tommi Rumpshonda L Marlette Curvin, PA-C 10/04/16 [phone]    Sharyn CreamerMark Quale, MD 10/04/16 [phone]

## 2016-10-04 NOTE — Discharge Instructions (Signed)
[facility] if any [facility]. Increase fluids especially clear liquids. Zofran as needed for nausea. Tessalon if needed for coughing. You may also take Tylenol or ibuprofen if needed for muscle aches or feeling feverish.

## 2016-10-09 ENCOUNTER — Encounter: Payer: Self-pay | Admitting: Emergency Medicine

## 2016-10-09 ENCOUNTER — Emergency Department
Admission: EM | Admit: 2016-10-09 | Discharge: 2016-10-09 | Disposition: A | Discharge status: Home / Self Care | Payer: Self-pay | Attending: Emergency Medicine | Admitting: Emergency Medicine

## 2016-10-09 ENCOUNTER — Emergency Department: Payer: Self-pay

## 2016-10-09 DIAGNOSIS — Z87891 Personal history of nicotine dependence: Secondary | ICD-10-CM | POA: Insufficient documentation

## 2016-10-09 DIAGNOSIS — R112 Nausea with vomiting, unspecified: Secondary | ICD-10-CM | POA: Insufficient documentation

## 2016-10-09 DIAGNOSIS — R1110 Vomiting, unspecified: Secondary | ICD-10-CM

## 2016-10-09 DIAGNOSIS — J209 Acute bronchitis, unspecified: Secondary | ICD-10-CM | POA: Insufficient documentation

## 2016-10-09 DIAGNOSIS — R197 Diarrhea, unspecified: Secondary | ICD-10-CM | POA: Insufficient documentation

## 2016-10-09 LAB — URINALYSIS, COMPLETE (UACMP) WITH MICROSCOPIC
BILIRUBIN URINE: NEGATIVE
Glucose, UA: NEGATIVE mg/dL
Hgb urine dipstick: NEGATIVE
Ketones, ur: 20 mg/dL — AB
Leukocytes, UA: NEGATIVE
Nitrite: NEGATIVE
PH: 7 (ref 5.0–8.0)
Protein, ur: NEGATIVE mg/dL
Specific Gravity, Urine: 1.026 (ref 1.005–1.030)

## 2016-10-09 LAB — CBC WITH DIFFERENTIAL/PLATELET
BASOS PCT: 0 %
Basophils Absolute: 0 10*3/uL (ref 0–0.1)
EOS ABS: 0.1 10*3/uL (ref 0–0.7)
EOS PCT: 2 %
HCT: 42.5 % (ref 35.0–47.0)
HEMOGLOBIN: 14.3 g/dL (ref 12.0–16.0)
Lymphocytes Relative: 12 %
Lymphs Abs: 1.2 10*3/uL (ref 1.0–3.6)
MCH: 28 pg (ref 26.0–34.0)
MCHC: 33.7 g/dL (ref 32.0–36.0)
MCV: 83.1 fL (ref 80.0–100.0)
MONO ABS: 0.7 10*3/uL (ref 0.2–0.9)
Monocytes Relative: 8 %
NEUTROS PCT: 78 %
Neutro Abs: 7.6 10*3/uL — ABNORMAL HIGH (ref 1.4–6.5)
PLATELETS: 282 10*3/uL (ref 150–440)
RBC: 5.12 MIL/uL (ref 3.80–5.20)
RDW: 13.3 % (ref 11.5–14.5)
WBC: 9.7 10*3/uL (ref 3.6–11.0)

## 2016-10-09 LAB — COMPREHENSIVE METABOLIC PANEL
ALK PHOS: 72 U/L (ref 38–126)
ALT: 23 U/L (ref 14–54)
AST: 22 U/L (ref 15–41)
Albumin: 4 g/dL (ref 3.5–5.0)
Anion gap: 6 (ref 5–15)
BUN: 19 mg/dL (ref 6–20)
CALCIUM: 9.2 mg/dL (ref 8.9–10.3)
CO2: 32 mmol/L (ref 22–32)
Chloride: 98 mmol/L — ABNORMAL LOW (ref 101–111)
Creatinine, Ser: 0.66 mg/dL (ref 0.44–1.00)
Glucose, Bld: 108 mg/dL — ABNORMAL HIGH (ref 65–99)
Potassium: 4 mmol/L (ref 3.5–5.1)
Sodium: 136 mmol/L (ref 135–145)
Total Bilirubin: 0.6 mg/dL (ref 0.3–1.2)
Total Protein: 8.3 g/dL — ABNORMAL HIGH (ref 6.5–8.1)

## 2016-10-09 LAB — POCT PREGNANCY, URINE: PREG TEST UR: NEGATIVE

## 2016-10-09 LAB — LIPASE, BLOOD: LIPASE: 24 U/L (ref 11–51)

## 2016-10-09 MED ORDER — ONDANSETRON HCL 4 MG/2ML IJ SOLN
4.0000 mg | Freq: Once | INTRAMUSCULAR | Status: AC
  Administered 2016-10-09: 4 mg via INTRAVENOUS

## 2016-10-09 MED ORDER — ONDANSETRON HCL 4 MG/2ML IJ SOLN
INTRAMUSCULAR | Status: AC
  Administered 2016-10-09: 4 mg via INTRAVENOUS
  Filled 2016-10-09: qty 2

## 2016-10-09 MED ORDER — AZITHROMYCIN 250 MG PO TABS: ORAL_TABLET | ORAL | 0 refills | Status: AC

## 2016-10-09 MED ORDER — ONDANSETRON HCL 4 MG PO TABS: 4.0000 mg | ORAL_TABLET | Freq: Three times a day (TID) | ORAL | 0 refills | Status: DC | PRN

## 2016-10-09 MED ORDER — SODIUM CHLORIDE 0.9 % IV BOLUS (SEPSIS)
1000.0000 mL | Freq: Once | INTRAVENOUS | Status: AC
  Administered 2016-10-09: 1000 mL via INTRAVENOUS

## 2016-10-09 NOTE — ED Notes (Signed)
Pt presents with cough, congestion, n/v/d. States that she began to feel ill on Thursday with sore throat and was seen here on Friday; prescribed cough meds. She reports that her sore throat has resolved. 3 x emesis this morning and 2 x diarrhea.

## 2016-10-09 NOTE — ED Triage Notes (Signed)
Patient seen here Friday 12/8 with body aches, congestion, headache, and vomiting.  States she has had no improvement.  Was given "cough pearls" with no relief.

## 2016-10-09 NOTE — ED Provider Notes (Addendum)
[facility] Emergency Department Provider Note  ____________________________________________   I have reviewed the triage vital signs and the nursing notes.   HISTORY  Chief Complaint Cough    HPI Karen Harrington is a 35 y.o. female who presents today complaining of multiple different things. Patient has had diarrhea, vomiting, and a cough for the last several days. She states that she had a fever initially but has not had a recent fever. She denies any focal abdominal pain. She denies any bleeding or hematemesis or bright red blood per rectum or melena. She feels depleted. The cough is occasionally productive.     History reviewed. No pertinent past medical history.  There are no active problems to display for this patient.   History reviewed. No pertinent surgical history.  Prior to Admission medications   Medication Sig Start Date End Date Taking? Authorizing Provider  benzonatate (TESSALON PERLES) 100 MG capsule Take 2 capsules (200 mg total) by mouth 3 (three) times daily as needed for cough. 10/04/16 10/04/17 Yes Tommi Rumpshonda L Summers, PA-C  albuterol (PROVENTIL HFA;VENTOLIN HFA) 108 (90 Base) MCG/ACT inhaler Inhale 1-2 puffs into the lungs every 6 (six) hours as needed for wheezing or shortness of breath. 01/09/16   Jami L Hagler, PA-C  hydrocortisone cream (PREPARATION H HYDROCORTISONE) 1 % Apply 1 application topically 2 (two) times daily. 05/21/16   Myrna Blazeravid Matthew Schaevitz, MD  ondansetron (ZOFRAN ODT) 4 MG disintegrating tablet Take 1 tablet (4 mg total) by mouth every 8 (eight) hours as needed for nausea or vomiting. 10/04/16   Tommi Rumpshonda L Summers, PA-C    Allergies Patient has no known allergies.  No family history on file.  Social History Social History  Substance Use Topics  . Smoking status: Former Smoker    Quit date: 10/28/2004  . Smokeless tobacco: Never Used  . Alcohol use No    Review of Systems Constitutional: No fever/chills Eyes: No  visual changes. ENT: No sore throat. No stiff neck no neck pain Cardiovascular: Denies chest pain. Respiratory: Denies shortness of breath.Positive cough Gastrointestinal:  See history of present illness  Genitourinary: Negative for dysuria. Musculoskeletal: Negative lower extremity swelling Skin: Negative for rash. Neurological: Negative for severe headaches, focal weakness or numbness. 10-point ROS otherwise negative.  ____________________________________________   PHYSICAL EXAM:  VITAL SIGNS: ED Triage Vitals  Enc Vitals Group     BP 10/09/16 0713 (!) 114/55     Pulse Rate 10/09/16 0713 (!) 107     Resp --      Temp 10/09/16 0713 97.9 F (36.6 C)     Temp Source 10/09/16 0713 Oral     SpO2 10/09/16 0707 96 %     Weight 10/09/16 0713 229 lb (103.9 kg)     Height 10/09/16 0713 5\' 7"  (1.702 m)     Head Circumference --      Peak Flow --      Pain Score 10/09/16 0713 8     Pain Loc --      Pain Edu? --      Excl. in GC? --     Constitutional: Alert and oriented. Well appearing and in no acute distress.Patient is nontoxic she looks so she doesn't feel well but she doesn't look in any sense dangerously L Eyes: Conjunctivae are normal. PERRL. EOMI. Head: Atraumatic. Nose: No congestion/rhinnorhea. Mouth/Throat: Mucous membranes are slightly dry.  Oropharynx non-erythematous. Neck: No stridor.   Nontender with no meningismus Cardiovascular: Normal rate, regular rhythm. Grossly normal heart  sounds.  Good peripheral circulation. Respiratory: Normal respiratory effort.  No retractions. Lungs CTAB. Abdominal: Soft and nontender. No distention. No guarding no rebound Back:  There is no focal tenderness or step off.  there is no midline tenderness there are no lesions noted. there is no CVA tenderness Musculoskeletal: No lower extremity tenderness, no upper extremity tenderness. No joint effusions, no DVT signs strong distal pulses no edema Neurologic:  Normal speech and language.  No gross focal neurologic deficits are appreciated.  Skin:  Skin is warm, dry and intact. No rash noted. Psychiatric: Mood and affect are normal. Speech and behavior are normal.  ____________________________________________   LABS (all labs ordered are listed, but only abnormal results are displayed)  Labs Reviewed  CBC WITH DIFFERENTIAL/PLATELET - Abnormal; Notable for the following:       Result Value   Neutro Abs 7.6 (*)    All other components within normal limits  COMPREHENSIVE METABOLIC PANEL  LIPASE, BLOOD  URINALYSIS, COMPLETE (UACMP) WITH MICROSCOPIC  POC URINE PREG, ED   ____________________________________________  EKG  I personally interpreted any EKGs ordered by me or triage  ____________________________________________  RADIOLOGY  I reviewed any imaging ordered by me or triage that were performed during my shift and, if possible, patient and/or family made aware of any abnormal findings. ____________________________________________   PROCEDURES  Procedure(s) performed: None  Procedures  Critical Care performed: None  ____________________________________________   INITIAL IMPRESSION / ASSESSMENT AND PLAN / ED COURSE  Pertinent labs & imaging results that were available during my care of the patient were reviewed by me and considered in my medical decision making (see chart for details).  Patient with likely viral syndrome, reassuring vital signs cough, nausea vomiting and diarrhea. Very large community burden of similar symptoms. Nonetheless, we will check blood work, IV fluids will be administered, antiemetics will be administered, we'll check a chest x-ray we'll try to ensure that were not missing something more serious because this is her second visit.  ----------------------------------------- 9:53 AM on 10/09/2016 -----------------------------------------  Asian feeling much better, abdomen remains benign. Awaiting urinalysis results. 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 Otherwise, very reassuring workup and exam here we will try to get her home safely.  ----------------------------------------- 10:15 AM on 10/09/2016 -----------------------------------------  Pt made aware of cxr findings and need to f/u. No sig cough noted here no vomiting noted here very reassuring w/u. Return precautions and need for pcp stressed and understood.   Clinical Course    ____________________________________________   FINAL CLINICAL IMPRESSION(S) / ED DIAGNOSES  Final diagnoses:  None      This chart was dictated using voice recognition software.  Despite best efforts to proofread,  errors can occur which can change meaning.      Jeanmarie PlantJames A McShane, MD 10/09/16 [id number]    Jeanmarie PlantJames A McShane, MD 10/09/16 [id number]    Jeanmarie PlantJames A McShane, MD 10/09/16 1016

## 2016-10-09 NOTE — ED Notes (Signed)
Pt discharged home after verbalizing understanding of discharge instructions; nad noted. 

## 2016-12-17 ENCOUNTER — Emergency Department
Admission: EM | Admit: 2016-12-17 | Discharge: 2016-12-17 | Disposition: A | Discharge status: Home / Self Care | Payer: Self-pay | Attending: Emergency Medicine | Admitting: Emergency Medicine

## 2016-12-17 DIAGNOSIS — B349 Viral infection, unspecified: Secondary | ICD-10-CM | POA: Insufficient documentation

## 2016-12-17 DIAGNOSIS — Z87891 Personal history of nicotine dependence: Secondary | ICD-10-CM | POA: Insufficient documentation

## 2016-12-17 LAB — INFLUENZA PANEL BY PCR (TYPE A & B)
INFLBPCR: NEGATIVE
Influenza A By PCR: NEGATIVE

## 2016-12-17 MED ORDER — BENZONATATE 100 MG PO CAPS: 200.0000 mg | ORAL_CAPSULE | Freq: Three times a day (TID) | ORAL | 0 refills | Status: DC | PRN

## 2016-12-17 MED ORDER — ONDANSETRON 4 MG PO TBDP: 4.0000 mg | ORAL_TABLET | Freq: Three times a day (TID) | ORAL | 0 refills | Status: DC | PRN

## 2016-12-17 MED ORDER — FIRST-DUKES MOUTHWASH MT SUSP: 5.0000 mL | Freq: Four times a day (QID) | OROMUCOSAL | 0 refills | Status: DC

## 2016-12-17 NOTE — ED Provider Notes (Signed)
[facility] Emergency Department Provider Note   ____________________________________________   First MD Initiated Contact with Patient 12/17/16 1019     (approximate)  I have reviewed the triage vital signs and the nursing notes.   HISTORY  Chief Complaint URI    HPI Karen Harrington is a 36 y.o. female is here with complaint of cough, body aches, chills, sore throat since yesterday. Patient is also had vomiting and diarrhea. She states the last time she had diarrhea was yesterday. She has not had any vomiting since last night. She's had some nausea today but has not eaten since yesterday. She has not taken any over-the-counter medication. She denies any urinary symptoms. She states that her temperature last evening was around 100 or 101.   History reviewed. No pertinent past medical history.  There are no active problems to display for this patient.   History reviewed. No pertinent surgical history.  Prior to Admission medications   Medication Sig Start Date End Date Taking? Authorizing Provider  benzonatate (TESSALON PERLES) 100 MG capsule Take 2 capsules (200 mg total) by mouth 3 (three) times daily as needed. 12/17/16 12/17/17  Tommi Rumpshonda L Summers, PA-C  Diphenhyd-Hydrocort-Nystatin (FIRST-DUKES MOUTHWASH) SUSP Use as directed 5 mLs in the mouth or throat 4 (four) times daily. 12/17/16   Tommi Rumpshonda L Summers, PA-C  ondansetron (ZOFRAN ODT) 4 MG disintegrating tablet Take 1 tablet (4 mg total) by mouth every 8 (eight) hours as needed for nausea or vomiting. 12/17/16   Tommi Rumpshonda L Summers, PA-C    Allergies Patient has no known allergies.  No family history on file.  Social History Social History  Substance Use Topics  . Smoking status: Former Smoker    Quit date: 10/28/2004  . Smokeless tobacco: Never Used  . Alcohol use No    Review of Systems Constitutional: Positive fever/chills Eyes: No visual changes. ENT: Positive sore throat. Cardiovascular:  Denies chest pain. Respiratory: Denies shortness of breath. Positive for cough. Gastrointestinal: No abdominal pain.  Positive nausea, positive vomiting.  Positive diarrhea.   Genitourinary: Negative for dysuria. Musculoskeletal: Positive for generalized body aches. Skin: Negative for rash. Neurological: Negative for headaches, focal weakness or numbness.  10-point ROS otherwise negative.  ____________________________________________   PHYSICAL EXAM:  VITAL SIGNS: ED Triage Vitals [12/17/16 0942]  Enc Vitals Group     BP      Pulse      Resp      Temp      Temp src      SpO2      Weight      Height      Head Circumference      Peak Flow      Pain Score 7     Pain Loc      Pain Edu?      Excl. in GC?     Constitutional: Alert and oriented. Well appearing and in no acute distress. Eyes: Conjunctivae are normal. PERRL. EOMI. Head: Atraumatic. Nose: Mild congestion/rhinnorhea. Mouth/Throat: Mucous membranes are moist.  Oropharynx non-erythematous. Neck: No stridor.   Hematological/Lymphatic/Immunilogical: No cervical lymphadenopathy. Cardiovascular: Normal rate, regular rhythm. Grossly normal heart sounds.  Good peripheral circulation. Respiratory: Normal respiratory effort.  No retractions. Lungs CTAB. Gastrointestinal: Soft and nontender. No distention. Bowel sounds normoactive 4 quadrants. Musculoskeletal: Moves upper and lower extremities without difficulty. Normal gait was noted. Neurologic:  Normal speech and language. No gross focal neurologic deficits are appreciated. No gait instability. Skin:  Skin is warm, dry  and intact. No rash noted. Psychiatric: Mood and affect are normal. Speech and behavior are normal.  ____________________________________________   LABS (all labs ordered are listed, but only abnormal results are displayed)  Labs Reviewed  INFLUENZA PANEL BY PCR (TYPE A & B)    ____________________________________________   PROCEDURES  Procedure(s) performed: None  Procedures  Critical Care performed: No  ____________________________________________   INITIAL IMPRESSION / ASSESSMENT AND PLAN / ED COURSE  Pertinent labs & imaging results that were available during my care of the patient were reviewed by me and considered in my medical decision making (see chart for details).  Patient's influenza test was negative. Patient was made aware. She is given a prescription for Zofran 4 mg ODT as needed for nausea, Tessalon Perles 2 capsules 3 times a day as needed for cough. She is continue taking Tylenol or ibuprofen as needed for fever, body aches, headache. Patient also requested something for her throat pain. She does not want to try Tylenol for her throat. She is given a prescription for Dukes mouthwash 5 ML's 4 times a day as needed for throat pain. She is to follow-up with [facility] if any continued problems.      ____________________________________________   FINAL CLINICAL IMPRESSION(S) / ED DIAGNOSES  Final diagnoses:  Viral illness      NEW MEDICATIONS STARTED DURING THIS VISIT:  Discharge Medication List as of 12/17/2016 11:49 AM       Note:  This document was prepared using Dragon voice recognition software and may include unintentional dictation errors.    Tommi Rumps, PA-C 12/17/16 1330    Rockne Menghini, MD 12/17/16 [phone]

## 2016-12-17 NOTE — Discharge Instructions (Signed)
[facility] if any continued problems. Continue to drink plenty of fluids. Tylenol or ibuprofen if needed for body aches, fever, headache. Zofran if needed for nausea. Tessalon Perles as needed for cough.

## 2016-12-17 NOTE — ED Triage Notes (Signed)
Pt c/o cough with bodyaches, chills, and sore throat since yesterday.

## 2016-12-17 NOTE — ED Notes (Signed)
See triage note  States she developed body aches with fever/chills and sore throat yesterday   Low grade fever on arrival

## 2016-12-17 NOTE — ED Notes (Signed)
Pt alert and oriented X4, active, cooperative, pt in NAD. RR even and unlabored, color WNL.  Pt informed to return if any life threatening symptoms occur.   

## 2017-01-04 ENCOUNTER — Emergency Department
Admission: EM | Admit: 2017-01-04 | Discharge: 2017-01-04 | Disposition: A | Discharge status: Home / Self Care | Payer: Self-pay | Attending: Emergency Medicine | Admitting: Emergency Medicine

## 2017-01-04 DIAGNOSIS — J209 Acute bronchitis, unspecified: Secondary | ICD-10-CM | POA: Insufficient documentation

## 2017-01-04 DIAGNOSIS — Z87891 Personal history of nicotine dependence: Secondary | ICD-10-CM | POA: Insufficient documentation

## 2017-01-04 MED ORDER — BENZONATATE 100 MG PO CAPS: 200.0000 mg | ORAL_CAPSULE | Freq: Three times a day (TID) | ORAL | 0 refills | Status: DC | PRN

## 2017-01-04 MED ORDER — AZITHROMYCIN 250 MG PO TABS: ORAL_TABLET | ORAL | 0 refills | Status: DC

## 2017-01-04 MED ORDER — IPRATROPIUM-ALBUTEROL 0.5-2.5 (3) MG/3ML IN SOLN
3.0000 mL | Freq: Once | RESPIRATORY_TRACT | Status: AC
  Administered 2017-01-04: 3 mL via RESPIRATORY_TRACT
  Filled 2017-01-04: qty 3

## 2017-01-04 MED ORDER — ALBUTEROL SULFATE HFA 108 (90 BASE) MCG/ACT IN AERS: 2.0000 | INHALATION_SPRAY | Freq: Four times a day (QID) | RESPIRATORY_TRACT | 2 refills | Status: DC | PRN

## 2017-01-04 NOTE — ED Provider Notes (Signed)
[facility] Emergency Department Provider Note  ____________________________________________   First MD Initiated Contact with Patient 01/04/17 1135     (approximate)  I have reviewed the triage vital signs and the nursing notes.   HISTORY  Chief Complaint Sore Throat; Cough; and Nasal Congestion    HPI Karen Harrington is a 36 y.o. female is here with complaint of sore throat, body aches, fever, cough and nasal congestion for the last 2 days. Patient states she has taken NyQuil for her symptoms without relief. Patient is former smoker and discontinued smoking in 2006. She states that she has had bronchitis in the past. Patient states that her coughing has made it difficult to sleep. She denies any vomiting or diarrhea. Currently she rates her pain as 9/10.   History reviewed. No pertinent past medical history.  There are no active problems to display for this patient.   History reviewed. No pertinent surgical history.  Prior to Admission medications   Medication Sig Start Date End Date Taking? Authorizing Provider  albuterol (PROVENTIL HFA;VENTOLIN HFA) 108 (90 Base) MCG/ACT inhaler Inhale 2 puffs into the lungs every 6 (six) hours as needed for wheezing or shortness of breath. 01/04/17   Tommi Rumps, PA-C  azithromycin (ZITHROMAX Z-PAK) 250 MG tablet Take 2 tablets (500 mg) on  Day 1,  followed by 1 tablet (250 mg) once daily on Days 2 through 5. 01/04/17   Tommi Rumps, PA-C  benzonatate (TESSALON PERLES) 100 MG capsule Take 2 capsules (200 mg total) by mouth 3 (three) times daily as needed. 01/04/17 01/04/18  Tommi Rumps, PA-C    Allergies Patient has no known allergies.  No family history on file.  Social History Social History  Substance Use Topics  . Smoking status: Former Smoker    Quit date: 10/28/2004  . Smokeless tobacco: Never Used  . Alcohol use No    Review of Systems Constitutional: Positive fever/chills Eyes: No visual  changes. ENT: No sore throat. Cardiovascular: Denies chest pain. Respiratory: Denies shortness of breath.  Positive nonproductive cough. Gastrointestinal: No abdominal pain.  No nausea, no vomiting.  No diarrhea.   Musculoskeletal: Positive for generalized body aches. Skin: Negative for rash. Neurological: Negative for headaches, focal weakness or numbness.  10-point ROS otherwise negative.  ____________________________________________   PHYSICAL EXAM:  VITAL SIGNS: ED Triage Vitals  Enc Vitals Group     BP 01/04/17 1125 116/62     Pulse Rate 01/04/17 1125 100     Resp 01/04/17 1125 18     Temp 01/04/17 1125 98.7 F (37.1 C)     Temp Source 01/04/17 1125 Oral     SpO2 01/04/17 1125 96 %     Weight 01/04/17 1126 223 lb (101.2 kg)     Height 01/04/17 1126 5\' 7"  (1.702 m)     Head Circumference --      Peak Flow --      Pain Score 01/04/17 1126 9     Pain Loc --      Pain Edu? --      Excl. in GC? --     Constitutional: Alert and oriented. Well appearing and in no acute distress. Eyes: Conjunctivae are normal. PERRL. EOMI. Head: Atraumatic. Nose: Mild congestion/rhinnorhea. Mouth/Throat: Mucous membranes are moist.  Oropharynx non-erythematous. Positive posterior drainage. Neck: No stridor.   Hematological/Lymphatic/Immunilogical: No cervical lymphadenopathy. Cardiovascular: Normal rate, regular rhythm. Grossly normal heart sounds.  Good peripheral circulation. Respiratory: Normal respiratory effort.  No  retractions. Lungs moderate coarse congested cough with minimal wheeze heard throughout. Faint wheeze is heard on expiration. Gastrointestinal: Soft and nontender. No distention.  Musculoskeletal: His upper and lower extremities without any difficulty. Normal gait was noted. Neurologic:  Normal speech and language. No gross focal neurologic deficits are appreciated. No gait instability. Skin:  Skin is warm, dry and intact. No rash noted. Psychiatric: Mood and affect are  normal. Speech and behavior are normal.  ____________________________________________   LABS (all labs ordered are listed, but only abnormal results are displayed)  Labs Reviewed - No data to display  PROCEDURES  Procedure(s) performed: None  Procedures  Critical Care performed: No  ____________________________________________   INITIAL IMPRESSION / ASSESSMENT AND PLAN / ED COURSE  Pertinent labs & imaging results that were available during my care of the patient were reviewed by me and considered in my medical decision making (see chart for details).  Patient was given DuoNeb treatment while in the emergency room and cough and breathing improved greatly. Patient was given a prescription for Tessalon Perles to every 8 hours as needed for cough. She is given a prescription for Z-Pak as directed along with a Proventil inhaler 2 puffs every 6 hours as needed for cough or wheezing. She [facility] if any continued problems.      ____________________________________________   FINAL CLINICAL IMPRESSION(S) / ED DIAGNOSES  Final diagnoses:  Acute bronchitis, unspecified organism      NEW MEDICATIONS STARTED DURING THIS VISIT:  New Prescriptions   ALBUTEROL (PROVENTIL HFA;VENTOLIN HFA) 108 (90 BASE) MCG/ACT INHALER    Inhale 2 puffs into the lungs every 6 (six) hours as needed for wheezing or shortness of breath.   AZITHROMYCIN (ZITHROMAX Z-PAK) 250 MG TABLET    Take 2 tablets (500 mg) on  Day 1,  followed by 1 tablet (250 mg) once daily on Days 2 through 5.   BENZONATATE (TESSALON PERLES) 100 MG CAPSULE    Take 2 capsules (200 mg total) by mouth 3 (three) times daily as needed.     Note:  This document was prepared using Dragon voice recognition software and may include unintentional dictation errors.    Tommi Rumpshonda L Keefe Zawistowski, PA-C 01/04/17 1253    Minna AntisKevin Paduchowski, MD 01/04/17 1351

## 2017-01-04 NOTE — Discharge Instructions (Signed)
Begin taking medications as directed.  Zpack for next 5 days.  Proventil inhaler 2 puffs 4 times a day and tessalon as needed for cough every 8 hours. Increase fluids [facility] if any continued problems

## 2017-01-04 NOTE — ED Notes (Signed)
Pt verbalized understanding of discharge instructions. NAD at this time. 

## 2017-01-04 NOTE — ED Triage Notes (Signed)
Pt reports sore throat, body aches, fever, cough and nasal congestion X 2 days. No medications taken today.

## 2017-01-04 NOTE — ED Notes (Signed)
Pt c/o sore throat, productive cough with green sputum, fevers, and generalized body aches x 2 days. Pt is noted to have a cough on assessment, states has been taking equate nyquil without relief.

## 2017-07-21 ENCOUNTER — Encounter: Payer: Self-pay | Admitting: Emergency Medicine

## 2017-07-21 ENCOUNTER — Emergency Department
Admission: EM | Admit: 2017-07-21 | Discharge: 2017-07-21 | Disposition: A | Discharge status: Home / Self Care | Payer: Self-pay | Attending: Emergency Medicine | Admitting: Emergency Medicine

## 2017-07-21 DIAGNOSIS — Z87891 Personal history of nicotine dependence: Secondary | ICD-10-CM | POA: Insufficient documentation

## 2017-07-21 DIAGNOSIS — J029 Acute pharyngitis, unspecified: Secondary | ICD-10-CM | POA: Insufficient documentation

## 2017-07-21 MED ORDER — DEXAMETHASONE SODIUM PHOSPHATE 10 MG/ML IJ SOLN
10.0000 mg | Freq: Once | INTRAMUSCULAR | Status: AC
  Administered 2017-07-21: 10 mg via INTRAMUSCULAR
  Filled 2017-07-21: qty 1

## 2017-07-21 MED ORDER — DEXAMETHASONE 1.5 MG (21) PO TBPK: 1.5000 mg | ORAL_TABLET | Freq: Every day | ORAL | 0 refills | Status: DC

## 2017-07-21 MED ORDER — LIDOCAINE VISCOUS 2 % MT SOLN: 10.0000 mL | OROMUCOSAL | 0 refills | Status: DC | PRN

## 2017-07-21 MED ORDER — PENICILLIN V POTASSIUM 500 MG PO TABS: 500.0000 mg | ORAL_TABLET | Freq: Three times a day (TID) | ORAL | 0 refills | Status: DC

## 2017-07-21 NOTE — ED Notes (Signed)
See triage note presents with sore throat for the past 2 days  Afebrile on arrival   No diff swallowing

## 2017-07-21 NOTE — ED Provider Notes (Signed)
[facility] Emergency Department Provider Note  ____________________________________________  Time seen: Approximately 1:23 PM  I have reviewed the triage vital signs and the nursing notes.   HISTORY  Chief Complaint Sore Throat    HPI Karen Harrington is a 36 y.o. female that presents to the emergency department for evaluation of sore throat for 2 days. She can see white spots on her tonsils. Patient states that she has been spitting up a lot of phlegm. No fever, congestion, shortness of breath, chest pain, nausea, vomiting.   History reviewed. No pertinent past medical history.  There are no active problems to display for this patient.   History reviewed. No pertinent surgical history.  Prior to Admission medications   Medication Sig Start Date End Date Taking? Authorizing Provider  albuterol (PROVENTIL HFA;VENTOLIN HFA) 108 (90 Base) MCG/ACT inhaler Inhale 2 puffs into the lungs every 6 (six) hours as needed for wheezing or shortness of breath. 01/04/17   Tommi Rumps, PA-C  azithromycin (ZITHROMAX Z-PAK) 250 MG tablet Take 2 tablets (500 mg) on  Day 1,  followed by 1 tablet (250 mg) once daily on Days 2 through 5. 01/04/17   Tommi Rumps, PA-C  benzonatate (TESSALON PERLES) 100 MG capsule Take 2 capsules (200 mg total) by mouth 3 (three) times daily as needed. 01/04/17 01/04/18  Tommi Rumps, PA-C  Dexamethasone (DEXPAK 6 DAY) 1.5 MG (21) TBPK Take 1.5 mg by mouth daily. Take 6 pills on day one, take 5 on day 2, take 4 on day 3, take 3 on day 4, take 2 on day 5, take one on day 6 07/21/17   Enid Derry, PA-C  lidocaine (XYLOCAINE) 2 % solution Use as directed 10 mLs in the mouth or throat as needed for mouth pain. 07/21/17   Enid Derry, PA-C  penicillin v potassium (VEETID) 500 MG tablet Take 1 tablet (500 mg total) by mouth 3 (three) times daily. 07/21/17   Enid Derry, PA-C    Allergies Patient has no known allergies.  No family  history on file.  Social History Social History  Substance Use Topics  . Smoking status: Former Smoker    Quit date: 10/28/2004  . Smokeless tobacco: Never Used  . Alcohol use No     Review of Systems  Constitutional: No fever/chills Cardiovascular: No chest pain. Respiratory: No cough. No SOB. Gastrointestinal: No abdominal pain.  No nausea, no vomiting.  Musculoskeletal: Negative for musculoskeletal pain. Skin: Negative for rash, abrasions, lacerations, ecchymosis. Neurological: Negative for headaches, numbness or tingling   ____________________________________________   PHYSICAL EXAM:  VITAL SIGNS: ED Triage Vitals  Enc Vitals Group     BP 07/21/17 1019 120/81     Pulse Rate 07/21/17 1019 100     Resp 07/21/17 1019 16     Temp 07/21/17 1018 98.3 F (36.8 C)     Temp Source 07/21/17 1018 Oral     SpO2 07/21/17 1019 100 %     Weight 07/21/17 1019 205 lb (93 kg)     Height 07/21/17 1019  (1.676 m)     Head Circumference --      Peak Flow --      Pain Score 07/21/17 1019 10     Pain Loc --      Pain Edu? --      Excl. in GC? --      Constitutional: Alert and oriented. Well appearing and in no acute distress. Eyes: Conjunctivae are normal.  PERRL. EOMI. Head: Atraumatic. ENT:      Ears: Tympanic membranes pearly with good landmarks.      Nose: No congestion/rhinnorhea.      Mouth/Throat: Mucous membranes are moist. Oropharynx non erythematous. Tonsils with exudates bilaterally. Uvula midline. Neck: No stridor.   Cardiovascular: Normal rate, regular rhythm.  Good peripheral circulation. Respiratory: Normal respiratory effort without tachypnea or retractions. Lungs CTAB. Good air entry to the bases with no decreased or absent breath sounds. Gastrointestinal: Bowel sounds 4 quadrants. Soft and nontender to palpation.  Musculoskeletal: Full range of motion to all extremities. No gross deformities appreciated. Neurologic:  Normal speech and language. No gross  focal neurologic deficits are appreciated.  Skin:  Skin is warm, dry and intact. No rash noted.    ____________________________________________   LABS (all labs ordered are listed, but only abnormal results are displayed)  Labs Reviewed - No data to display ____________________________________________  EKG   ____________________________________________  RADIOLOGY  No results found.  ____________________________________________    PROCEDURES  Procedure(s) performed:    Procedures    Medications  dexamethasone (DECADRON) injection 10 mg (10 mg Intramuscular Given 07/21/17 1154)     ____________________________________________   INITIAL IMPRESSION / ASSESSMENT AND PLAN / ED COURSE  Pertinent labs & imaging results that were available during my care of the patient were reviewed by me and considered in my medical decision making (see chart for details).  Review of the [place] CSRS was performed in accordance of the [facility] prior to dispensing any controlled drugs.     Patient's diagnosis is consistent with Pharyngitis. Vital signs and exam are reassuring. Strep negative. Patient is spitting up a significant amount of phlegm in the ED. She was given IM Decadron. Patient will be discharged home with prescriptions for Penicillin, Decadron, viscous lidocaine. Patient is to follow up with PCP as directed. Patient is given ED precautions to return to the ED for any worsening or new symptoms.     ____________________________________________  FINAL CLINICAL IMPRESSION(S) / ED DIAGNOSES  Final diagnoses:  Pharyngitis, unspecified etiology      NEW MEDICATIONS STARTED DURING THIS VISIT:  Discharge Medication List as of 07/21/2017 12:30 PM    START taking these medications   Details  Dexamethasone (DEXPAK 6 DAY) 1.5 MG (21) TBPK Take 1.5 mg by mouth daily. Take 6 pills on day one, take 5 on day 2, take 4 on day 3, take 3 on day 4, take 2 on day 5, take one on day 6,  Starting Mon 07/21/2017, Print    lidocaine (XYLOCAINE) 2 % solution Use as directed 10 mLs in the mouth or throat as needed for mouth pain., Starting Mon 07/21/2017, Print    penicillin v potassium (VEETID) 500 MG tablet Take 1 tablet (500 mg total) by mouth 3 (three) times daily., Starting Mon 07/21/2017, Print            This chart was dictated using voice recognition software/Dragon. Despite best efforts to proofread, errors can occur which can change the meaning. Any change was purely unintentional.    Enid Derry, PA-C 07/21/17 1327    Pershing Proud, Myra Rude, MD 07/21/17 [phone]

## 2017-07-21 NOTE — ED Triage Notes (Signed)
Sore throat for 2 days

## 2017-09-16 ENCOUNTER — Emergency Department
Admission: EM | Admit: 2017-09-16 | Discharge: 2017-09-16 | Disposition: A | Discharge status: Home / Self Care | Payer: Self-pay | Attending: Emergency Medicine | Admitting: Emergency Medicine

## 2017-09-16 ENCOUNTER — Encounter: Payer: Self-pay | Admitting: *Deleted

## 2017-09-16 DIAGNOSIS — B9789 Other viral agents as the cause of diseases classified elsewhere: Secondary | ICD-10-CM | POA: Insufficient documentation

## 2017-09-16 DIAGNOSIS — R509 Fever, unspecified: Secondary | ICD-10-CM | POA: Insufficient documentation

## 2017-09-16 DIAGNOSIS — J069 Acute upper respiratory infection, unspecified: Secondary | ICD-10-CM | POA: Insufficient documentation

## 2017-09-16 DIAGNOSIS — R0981 Nasal congestion: Secondary | ICD-10-CM | POA: Insufficient documentation

## 2017-09-16 DIAGNOSIS — Z87891 Personal history of nicotine dependence: Secondary | ICD-10-CM | POA: Insufficient documentation

## 2017-09-16 MED ORDER — PSEUDOEPH-BROMPHEN-DM 30-2-10 MG/5ML PO SYRP: 5.0000 mL | ORAL_SOLUTION | Freq: Four times a day (QID) | ORAL | 0 refills | Status: DC | PRN

## 2017-09-16 NOTE — ED Notes (Signed)
Pt laying in bed resting appears in no distress.

## 2017-09-16 NOTE — Discharge Instructions (Signed)
begin taking Bromfed DM as needed for cough and congestion. increase fluids. Tylenol or ibuprofen as needed for body aches or fever. Follow-up with your primary care provider or any urgent care if needed for continued problems.

## 2017-09-16 NOTE — ED Provider Notes (Signed)
[facility] Emergency Department Provider Note   ____________________________________________   First MD Initiated Contact with Patient 09/16/17 [phone]     (approximate)  I have reviewed the triage vital signs and the nursing notes.   HISTORY  Chief Complaint Generalized Body Aches and Fever   HPI Karen Harrington is a 36 y.o. female is here with complaint of cough, body aches and fever for approximately 3 days. Patient states that she began coughing last evening. She has used some over-the-counter medication without improvement. She denies any vomiting or diarrhea.   History reviewed. No pertinent past medical history.  There are no active problems to display for this patient.   History reviewed. No pertinent surgical history.  Prior to Admission medications   Medication Sig Start Date End Date Taking? Authorizing Provider  albuterol (PROVENTIL HFA;VENTOLIN HFA) 108 (90 Base) MCG/ACT inhaler Inhale 2 puffs into the lungs every 6 (six) hours as needed for wheezing or shortness of breath. 01/04/17   Tommi RumpsSummers, Etherine Mackowiak L, PA-C  brompheniramine-pseudoephedrine-DM 30-2-10 MG/5ML syrup Take 5 mLs 4 (four) times daily as needed by mouth. 09/16/17   Tommi RumpsSummers, Macklyn Glandon L, PA-C    Allergies Patient has no known allergies.  History reviewed. No pertinent family history.  Social History Social History   Tobacco Use  . Smoking status: Former Smoker    Last attempt to quit: 10/28/2004    Years since quitting: 12.8  . Smokeless tobacco: Never Used  Substance Use Topics  . Alcohol use: No  . Drug use: Not on file    Review of Systems Constitutional: subjective fever/chills Eyes: No visual changes. ENT: No sore throat.  Positive nasal congestion. Cardiovascular: Denies chest pain. Respiratory: Denies shortness of breath.  Positive for cough. Gastrointestinal: No abdominal pain.  No nausea, no vomiting.  No diarrhea.  Musculoskeletal:positive for body  aches. Skin: Negative for rash. Neurological: Negative for headaches ____________________________________________   PHYSICAL EXAM:  VITAL SIGNS: ED Triage Vitals  Enc Vitals Group     BP 09/16/17 0722 117/82     Pulse Rate 09/16/17 0722 93     Resp 09/16/17 0722 18     Temp 09/16/17 0722 98.8 F (37.1 C)     Temp Source 09/16/17 0722 Oral     SpO2 09/16/17 0722 97 %     Weight 09/16/17 0723 200 lb (90.7 kg)     Height 09/16/17 0723 5\' 7"  (1.702 m)     Head Circumference --      Peak Flow --      Pain Score 09/16/17 0739 8     Pain Loc --      Pain Edu? --      Excl. in GC? --    Constitutional: Alert and oriented. Well appearing and in no acute distress. Eyes: Conjunctivae are normal.  Head: Atraumatic. Nose: moderate congestion/rhinnorhea.   TMs are dull bilaterally. EACs are clear. Mouth/Throat: Mucous membranes are moist.  Oropharynx non-erythematous. Neck: No stridor.   Hematological/Lymphatic/Immunilogical: No cervical lymphadenopathy. Cardiovascular: Normal rate, regular rhythm. Grossly normal heart sounds.  Good peripheral circulation. Respiratory: Normal respiratory effort.  No retractions. Lungs CTAB.  Congested cough. Musculoskeletal: moves upper and lower extremities without difficulty. Normal gait was noted. Neurologic:  Normal speech and language. No gross focal neurologic deficits are appreciated. Skin:  Skin is warm, dry and intact. No rash noted. Psychiatric: Mood and affect are normal. Speech and behavior are normal.  ____________________________________________   LABS (all labs ordered are listed, but  only abnormal results are displayed)  Labs Reviewed - No data to display  PROCEDURES  Procedure(s) performed: None  Procedures  Critical Care performed: No  ____________________________________________   INITIAL IMPRESSION / ASSESSMENT AND PLAN / ED COURSE Patient was encouraged to follow-up with her PCP no clinic if any continued problems.  She is given a prescription for both a DM as needed for cough and congestion. She is to increase fluids. Tylenol or ibuprofen as needed for body aches or fever.patient was also given a note to remain out of work for 2 days.  ____________________________________________   FINAL CLINICAL IMPRESSION(S) / ED DIAGNOSES  Final diagnoses:  Viral URI with cough     ED Discharge Orders        Ordered    brompheniramine-pseudoephedrine-DM 30-2-10 MG/5ML syrup  4 times daily PRN     09/16/17 0804       Note:  This document was prepared using Dragon voice recognition software and may include unintentional dictation errors.    Tommi RumpsSummers, Brennyn Haisley L, PA-C 09/16/17 [phone]    Arnaldo NatalMalinda, Paul F, MD 09/16/17 1140

## 2017-09-16 NOTE — ED Triage Notes (Signed)
States cough, body aches and fever since Saturday

## 2018-02-02 ENCOUNTER — Other Ambulatory Visit: Payer: Self-pay

## 2018-02-02 ENCOUNTER — Emergency Department
Admission: EM | Admit: 2018-02-02 | Discharge: 2018-02-02 | Disposition: A | Discharge status: Home / Self Care | Payer: Self-pay | Attending: Emergency Medicine | Admitting: Emergency Medicine

## 2018-02-02 ENCOUNTER — Encounter: Payer: Self-pay | Admitting: Emergency Medicine

## 2018-02-02 DIAGNOSIS — Z79899 Other long term (current) drug therapy: Secondary | ICD-10-CM | POA: Insufficient documentation

## 2018-02-02 DIAGNOSIS — Z87891 Personal history of nicotine dependence: Secondary | ICD-10-CM | POA: Insufficient documentation

## 2018-02-02 DIAGNOSIS — B9789 Other viral agents as the cause of diseases classified elsewhere: Secondary | ICD-10-CM

## 2018-02-02 DIAGNOSIS — J988 Other specified respiratory disorders: Secondary | ICD-10-CM | POA: Insufficient documentation

## 2018-02-02 LAB — INFLUENZA PANEL BY PCR (TYPE A & B)
INFLAPCR: NEGATIVE
INFLBPCR: NEGATIVE

## 2018-02-02 MED ORDER — IBUPROFEN 600 MG PO TABS: 600.0000 mg | ORAL_TABLET | Freq: Three times a day (TID) | ORAL | 0 refills | Status: DC | PRN

## 2018-02-02 MED ORDER — IBUPROFEN 600 MG PO TABS
600.0000 mg | ORAL_TABLET | Freq: Once | ORAL | Status: AC
  Administered 2018-02-02: 600 mg via ORAL
  Filled 2018-02-02: qty 1

## 2018-02-02 MED ORDER — PSEUDOEPH-BROMPHEN-DM 30-2-10 MG/5ML PO SYRP: 5.0000 mL | ORAL_SOLUTION | Freq: Four times a day (QID) | ORAL | 0 refills | Status: DC | PRN

## 2018-02-02 NOTE — ED Notes (Signed)
See triage note  Presents with body aches,chills,subjective fever and sore throat    States sx's started yesterday  Afebrile on arrival

## 2018-02-02 NOTE — ED Triage Notes (Signed)
Started with flu like symptoms yesterday.  C/o cough, congestion, body aches, headaches, and sore throat. Has been around flu at work. Mask applied. NAD. Unlabored. Unsure if had temp

## 2018-02-02 NOTE — ED Provider Notes (Signed)
[facility] Emergency Department Provider Note   ____________________________________________   First MD Initiated Contact with Patient 02/02/18 1022     (approximate)  I have reviewed the triage vital signs and the nursing notes.   HISTORY  Chief Complaint flu like symptoms    HPI Karen Harrington is a 37 y.o. female patient complain of body aches, cough, nasal chest congestion, frontal headache, sore throat.  Patient states he has been exposed to flu by employees at work.  Patient not taking flu shot for this season.  Patient denies nausea, vomiting, diarrhea.  Patient rates the pain as 8/10.  Patient described the pain is "generalized aching".  No palliative measures for complaint.  History reviewed. No pertinent past medical history.  There are no active problems to display for this patient.   History reviewed. No pertinent surgical history.  Prior to Admission medications   Medication Sig Start Date End Date Taking? Authorizing Provider  albuterol (PROVENTIL HFA;VENTOLIN HFA) 108 (90 Base) MCG/ACT inhaler Inhale 2 puffs into the lungs every 6 (six) hours as needed for wheezing or shortness of breath. 01/04/17   Tommi Rumps, PA-C  brompheniramine-pseudoephedrine-DM 30-2-10 MG/5ML syrup Take 5 mLs 4 (four) times daily as needed by mouth. 09/16/17   Tommi Rumps, PA-C  brompheniramine-pseudoephedrine-DM 30-2-10 MG/5ML syrup Take 5 mLs by mouth 4 (four) times daily as needed. 02/02/18   Joni Reining, PA-C  ibuprofen (ADVIL,MOTRIN) 600 MG tablet Take 1 tablet (600 mg total) by mouth every 8 (eight) hours as needed. 02/02/18   Joni Reining, PA-C    Allergies Patient has no known allergies.  History reviewed. No pertinent family history.  Social History Social History   Tobacco Use  . Smoking status: Former Smoker    Last attempt to quit: 10/28/2004    Years since quitting: 13.2  . Smokeless tobacco: Never Used  Substance Use Topics  .  Alcohol use: No  . Drug use: Never    Review of Systems Constitutional: No fever/chills.  Body ache. Eyes: No visual changes. ENT: Sore throat.   Cardiovascular: Denies chest pain. Respiratory: Nonproductive cough.. Gastrointestinal: No abdominal pain.  No nausea, no vomiting.  No diarrhea.  No constipation. Genitourinary: Negative for dysuria. Musculoskeletal: Negative for back pain. Skin: Negative for rash. Neurological: Negative for headaches, focal weakness or numbness.  ____________________________________________   PHYSICAL EXAM:  VITAL SIGNS: ED Triage Vitals  Enc Vitals Group     BP 02/02/18 1022 106/82     Pulse Rate 02/02/18 1022 (!) 120     Resp 02/02/18 1022 20     Temp 02/02/18 1021 98.6 F (37 C)     Temp Source 02/02/18 1021 Oral     SpO2 02/02/18 1022 98 %     Weight 02/02/18 1020 200 lb (90.7 kg)     Height 02/02/18 1020 5\' 7"  (1.702 m)     Head Circumference --      Peak Flow --      Pain Score 02/02/18 1020 8     Pain Loc --      Pain Edu? --      Excl. in GC? --    Constitutional: Alert and oriented. Well appearing and in no acute distress. Nose: Edematous nasal turbinates clear rhinorrhea.   Mouth/Throat: Mucous membranes are moist.  Oropharynx non-erythematous.  Postnasal drainage. Neck: No stridor. Hematological/Lymphatic/Immunilogical: No cervical lymphadenopathy. Cardiovascular: Normal rate, regular rhythm. Grossly normal heart sounds.  Good peripheral circulation. Respiratory:  Normal respiratory effort.  No retractions. Lungs CTAB. Gastrointestinal: Soft and nontender. No distention. No abdominal bruits. No CVA tenderness.  Neurologic:  Normal speech and language. No gross focal neurologic deficits are appreciated. No gait instability. Skin:  Skin is warm, dry and intact. No rash noted. Psychiatric: Mood and affect are normal. Speech and behavior are normal.  ____________________________________________   LABS (all labs ordered are  listed, but only abnormal results are displayed)  Labs Reviewed  INFLUENZA PANEL BY PCR (TYPE A & B)   ____________________________________________  EKG   ____________________________________________  RADIOLOGY  ED MD interpretation:    Official radiology report(s): No results found.  ____________________________________________   PROCEDURES  Procedure(s) performed: None  Procedures  Critical Care performed: No  ____________________________________________   INITIAL IMPRESSION / ASSESSMENT AND PLAN / ED COURSE  As part of my medical decision making, I reviewed the following data within the electronic MEDICAL RECORD NUMBER    Viral respiratory illness.  Discussed with patient negative flu results.  Patient given discharge care instruction advised take medication as directed.  Patient given a work note.  [facility].      ____________________________________________   FINAL CLINICAL IMPRESSION(S) / ED DIAGNOSES  Final diagnoses:  Viral respiratory illness     ED Discharge Orders        Ordered    ibuprofen (ADVIL,MOTRIN) 600 MG tablet  Every 8 hours PRN     02/02/18 1146    brompheniramine-pseudoephedrine-DM 30-2-10 MG/5ML syrup  4 times daily PRN     02/02/18 1146       Note:  This document was prepared using Dragon voice recognition software and may include unintentional dictation errors.    Joni ReiningSmith, Shanon Becvar K, PA-C 02/02/18 1148    Minna AntisPaduchowski, Kevin, MD 02/02/18 [phone]

## 2018-02-24 ENCOUNTER — Telehealth: Payer: Self-pay | Admitting: Adult Health Nurse Practitioner

## 2018-02-24 NOTE — Telephone Encounter (Signed)
Called and began interview process with her. She makes $1,841.67/month, and I told her she was ineligible to be seen at the clinic.

## 2018-12-18 ENCOUNTER — Emergency Department
Admission: EM | Admit: 2018-12-18 | Discharge: 2018-12-18 | Disposition: A | Discharge status: Home / Self Care | Payer: Medicaid Other | Attending: Emergency Medicine | Admitting: Emergency Medicine

## 2018-12-18 ENCOUNTER — Encounter: Payer: Self-pay | Admitting: Emergency Medicine

## 2018-12-18 ENCOUNTER — Other Ambulatory Visit: Payer: Self-pay

## 2018-12-18 DIAGNOSIS — J111 Influenza due to unidentified influenza virus with other respiratory manifestations: Secondary | ICD-10-CM | POA: Insufficient documentation

## 2018-12-18 DIAGNOSIS — R69 Illness, unspecified: Secondary | ICD-10-CM

## 2018-12-18 DIAGNOSIS — Z87891 Personal history of nicotine dependence: Secondary | ICD-10-CM | POA: Insufficient documentation

## 2018-12-18 DIAGNOSIS — Z79899 Other long term (current) drug therapy: Secondary | ICD-10-CM | POA: Insufficient documentation

## 2018-12-18 MED ORDER — ACETAMINOPHEN 500 MG PO TABS
ORAL_TABLET | ORAL | Status: AC
  Filled 2018-12-18: qty 2

## 2018-12-18 MED ORDER — ONDANSETRON HCL 8 MG PO TABS: 8.0000 mg | ORAL_TABLET | Freq: Three times a day (TID) | ORAL | 0 refills | Status: DC | PRN

## 2018-12-18 MED ORDER — ACETAMINOPHEN 500 MG PO TABS
1000.0000 mg | ORAL_TABLET | Freq: Once | ORAL | Status: AC
  Administered 2018-12-18: 1000 mg via ORAL

## 2018-12-18 MED ORDER — IBUPROFEN 600 MG PO TABS: 600.0000 mg | ORAL_TABLET | Freq: Three times a day (TID) | ORAL | 0 refills | Status: DC | PRN

## 2018-12-18 MED ORDER — OSELTAMIVIR PHOSPHATE 75 MG PO CAPS: 75.0000 mg | ORAL_CAPSULE | Freq: Two times a day (BID) | ORAL | 0 refills | Status: AC

## 2018-12-18 MED ORDER — PSEUDOEPH-BROMPHEN-DM 30-2-10 MG/5ML PO SYRP: 5.0000 mL | ORAL_SOLUTION | Freq: Four times a day (QID) | ORAL | 0 refills | Status: DC | PRN

## 2018-12-18 NOTE — ED Provider Notes (Signed)
[facility] Emergency Department Provider Note   ____________________________________________   First MD Initiated Contact with Patient 12/18/18 [phone]     (approximate)  I have reviewed the triage vital signs and the nursing notes.   HISTORY  Chief Complaint Cough    HPI Karen Harrington is a 38 y.o. female patient presents with cough, fever, chills, and body aches for 2 days.  Patient state nausea this morning but no vomiting.  Patient rates her body ache as a 5/10.  Patient had taken flu shot for this season.  Patient febrile in triage and given Tylenol.   History reviewed. No pertinent past medical history.  There are no active problems to display for this patient.   History reviewed. No pertinent surgical history.  Prior to Admission medications   Medication Sig Start Date End Date Taking? Authorizing Provider  albuterol (PROVENTIL HFA;VENTOLIN HFA) 108 (90 Base) MCG/ACT inhaler Inhale 2 puffs into the lungs every 6 (six) hours as needed for wheezing or shortness of breath. 01/04/17   Tommi Rumps, PA-C  brompheniramine-pseudoephedrine-DM 30-2-10 MG/5ML syrup Take 5 mLs by mouth 4 (four) times daily as needed. 12/18/18   Joni Reining, PA-C  ibuprofen (ADVIL,MOTRIN) 600 MG tablet Take 1 tablet (600 mg total) by mouth every 8 (eight) hours as needed. 12/18/18   Joni Reining, PA-C  ondansetron (ZOFRAN) 8 MG tablet Take 1 tablet (8 mg total) by mouth every 8 (eight) hours as needed for nausea or vomiting. 12/18/18   Joni Reining, PA-C  oseltamivir (TAMIFLU) 75 MG capsule Take 1 capsule (75 mg total) by mouth 2 (two) times daily for 5 days. 12/18/18 12/23/18  Joni Reining, PA-C    Allergies Patient has no known allergies.  No family history on file.  Social History Social History   Tobacco Use  . Smoking status: Former Smoker    Last attempt to quit: 10/28/2004    Years since quitting: 14.1  . Smokeless tobacco: Never Used  Substance  Use Topics  . Alcohol use: No  . Drug use: Never    Review of Systems Constitutional: Fever/chills.  Body aches. Eyes: No visual changes. ENT: No sore throat.  Nasal congestion. Cardiovascular: Denies chest pain. Respiratory: Denies shortness of breath.  Nonproductive cough.  Gastrointestinal: No abdominal pain.  Nausea without vomiting.  No diarrhea.  No constipation. Genitourinary: Negative for dysuria. Musculoskeletal: Negative for back pain. Skin: Negative for rash. Neurological: Negative for headaches, focal weakness or numbness.   ____________________________________________   PHYSICAL EXAM:  VITAL SIGNS: ED Triage Vitals  Enc Vitals Group     BP 12/18/18 0931 128/83     Pulse Rate 12/18/18 0931 (!) 115     Resp 12/18/18 0931 16     Temp 12/18/18 0931 (!) 102.6 F (39.2 C)     Temp Source 12/18/18 0931 Oral     SpO2 12/18/18 0931 98 %     Weight 12/18/18 0930 218 lb (98.9 kg)     Height 12/18/18 0930 5\' 6"  (1.676 m)     Head Circumference --      Peak Flow --      Pain Score 12/18/18 0930 0     Pain Loc --      Pain Edu? --      Excl. in GC? --     Constitutional: Alert and oriented.  Febrile and moderate distress.   Eyes: Conjunctivae are normal. PERRL. EOMI. Head: Atraumatic. Nose: Clear rhinorrhea.  Mouth/Throat: Mucous membranes are moist.  Oropharynx non-erythematous.  Postnasal drainage. Neck: No stridor.  Hematological/Lymphatic/Immunilogical: No cervical lymphadenopathy. Cardiovascular: Tachycardic, regular rhythm. Grossly normal heart sounds.  Good peripheral circulation. Respiratory: Normal respiratory effort.  No retractions. Lungs CTAB. Gastrointestinal: Soft and nontender. No distention. No abdominal bruits. No CVA tenderness. Skin:  Skin is warm, dry and intact. No rash noted. Psychiatric: Mood and affect are normal. Speech and behavior are normal.  ____________________________________________   LABS (all labs ordered are listed, but only  abnormal results are displayed)  Labs Reviewed - No data to display ____________________________________________  EKG   ____________________________________________  RADIOLOGY  ED MD interpretation:    Official radiology report(s): No results found.  ____________________________________________   PROCEDURES  Procedure(s) performed: None  Procedures  Critical Care performed: No  ____________________________________________   INITIAL IMPRESSION / ASSESSMENT AND PLAN / ED COURSE  As part of my medical decision making, I reviewed the following data within the electronic MEDICAL RECORD NUMBER     Patient presents with cough, fever, chills, and body aches consistent with flulike symptoms.  Patient given discharge care instruction advised take medication as directed.  [facility]      ____________________________________________   FINAL CLINICAL IMPRESSION(S) / ED DIAGNOSES  Final diagnoses:  Influenza-like illness     ED Discharge Orders         Ordered    oseltamivir (TAMIFLU) 75 MG capsule  2 times daily     12/18/18 0957    brompheniramine-pseudoephedrine-DM 30-2-10 MG/5ML syrup  4 times daily PRN     12/18/18 0957    ibuprofen (ADVIL,MOTRIN) 600 MG tablet  Every 8 hours PRN     12/18/18 0957    ondansetron (ZOFRAN) 8 MG tablet  Every 8 hours PRN     12/18/18 0957           Note:  This document was prepared using Dragon voice recognition software and may include unintentional dictation errors.    Joni Reining, PA-C 12/18/18 1003    Sharyn Creamer, MD 12/18/18 [phone]

## 2018-12-18 NOTE — ED Notes (Signed)
See triage note  Presents with cough for couple of days  Fever and body aches started yesterday  Febrile on arrival

## 2018-12-18 NOTE — ED Triage Notes (Signed)
Cough, fever, chills x 2 days.

## 2019-10-21 ENCOUNTER — Ambulatory Visit: Payer: HRSA Program | Attending: Internal Medicine

## 2019-10-21 DIAGNOSIS — Z20822 Contact with and (suspected) exposure to covid-19: Secondary | ICD-10-CM

## 2019-10-21 DIAGNOSIS — Z20828 Contact with and (suspected) exposure to other viral communicable diseases: Secondary | ICD-10-CM | POA: Diagnosis present

## 2019-10-23 LAB — NOVEL CORONAVIRUS, NAA: SARS-CoV-2, NAA: NOT DETECTED

## 2019-11-05 ENCOUNTER — Ambulatory Visit: Payer: Self-pay | Attending: Internal Medicine

## 2019-11-05 DIAGNOSIS — Z20822 Contact with and (suspected) exposure to covid-19: Secondary | ICD-10-CM | POA: Insufficient documentation

## 2019-11-07 LAB — NOVEL CORONAVIRUS, NAA: SARS-CoV-2, NAA: NOT DETECTED

## 2020-04-09 ENCOUNTER — Other Ambulatory Visit: Payer: Self-pay

## 2020-04-09 ENCOUNTER — Emergency Department
Admission: EM | Admit: 2020-04-09 | Discharge: 2020-04-09 | Disposition: A | Discharge status: Home / Self Care | Payer: Self-pay | Attending: Emergency Medicine | Admitting: Emergency Medicine

## 2020-04-09 ENCOUNTER — Emergency Department: Payer: Self-pay

## 2020-04-09 ENCOUNTER — Encounter: Payer: Self-pay | Admitting: Emergency Medicine

## 2020-04-09 DIAGNOSIS — Z87891 Personal history of nicotine dependence: Secondary | ICD-10-CM | POA: Insufficient documentation

## 2020-04-09 DIAGNOSIS — J209 Acute bronchitis, unspecified: Secondary | ICD-10-CM | POA: Insufficient documentation

## 2020-04-09 MED ORDER — AZITHROMYCIN 250 MG PO TABS: ORAL_TABLET | ORAL | 0 refills | Status: AC

## 2020-04-09 MED ORDER — ALBUTEROL SULFATE HFA 108 (90 BASE) MCG/ACT IN AERS: 2.0000 | INHALATION_SPRAY | Freq: Four times a day (QID) | RESPIRATORY_TRACT | 0 refills | Status: DC | PRN

## 2020-04-09 MED ORDER — PREDNISONE 10 MG PO TABS: ORAL_TABLET | ORAL | 0 refills | Status: DC

## 2020-04-09 NOTE — ED Provider Notes (Signed)
[facility] Emergency Department Provider Note  ____________________________________________   First MD Initiated Contact with Patient 04/09/20 0820     (approximate)  I have reviewed the triage vital signs and the nursing notes.   HISTORY  Chief Complaint Cough and Nasal Congestion   HPI Karen Harrington is a 39 y.o. female presents to the ED with complaint of cough, congestion, rhinorrhea and nasal congestion.  Patient had 2 Covid test done last week which were negative.  She also had a negative Covid test in December, 2020 and another one in January 2021.  Patient has tried over-the-counter medications without any improvement.  Patient reports that she is a non-smoker however her social history shows that she was a former smoker.  She reports that she has had both bronchitis and pneumonia in the past.  She denies any fever or chills.      History reviewed. No pertinent past medical history.  There are no problems to display for this patient.   History reviewed. No pertinent surgical history.  Prior to Admission medications   Medication Sig Start Date End Date Taking? Authorizing Provider  albuterol (VENTOLIN HFA) 108 (90 Base) MCG/ACT inhaler Inhale 2 puffs into the lungs every 6 (six) hours as needed for wheezing or shortness of breath. 04/09/20   Tommi Rumps, PA-C  azithromycin (ZITHROMAX Z-PAK) 250 MG tablet Take 2 tablets (500 mg) on  Day 1,  followed by 1 tablet (250 mg) once daily on Days 2 through 5. 04/09/20 04/14/20  Tommi Rumps, PA-C  predniSONE (DELTASONE) 10 MG tablet Take 6 tablets  today, on day 2 take 5 tablets, day 3 take 4 tablets, day 4 take 3 tablets, day 5 take  2 tablets and 1 tablet the last day 04/09/20   Tommi Rumps, PA-C    Allergies Patient has no known allergies.  History reviewed. No pertinent family history.  Social History Social History   Tobacco Use  . Smoking status: Former Smoker    Quit date:  10/28/2004    Years since quitting: 15.4  . Smokeless tobacco: Never Used  Substance Use Topics  . Alcohol use: No  . Drug use: Never    Review of Systems Constitutional: No fever/chills Eyes: No visual changes. ENT: No sore throat.  Positive for nasal congestion. Cardiovascular: Denies chest pain. Respiratory: Denies shortness of breath.  For cough. Gastrointestinal: No abdominal pain.  No nausea, no vomiting.  No diarrhea.  No constipation. Genitourinary: Negative for dysuria. Musculoskeletal: Negative for back pain. Skin: Negative for rash. Neurological: Negative for headaches, focal weakness or numbness.  ____________________________________________   PHYSICAL EXAM:  VITAL SIGNS: ED Triage Vitals  Enc Vitals Group     BP 04/09/20 0802 (!) 144/80     Pulse Rate 04/09/20 0802 91     Resp 04/09/20 0802 18     Temp 04/09/20 0802 98.6 F (37 C)     Temp Source 04/09/20 0802 Oral     SpO2 04/09/20 0802 98 %     Weight 04/09/20 0803 240 lb (108.9 kg)     Height 04/09/20 0803 5\' 7"  (1.702 m)     Head Circumference --      Peak Flow --      Pain Score 04/09/20 0803 0     Pain Loc --      Pain Edu? --      Excl. in GC? --     Constitutional: Alert and oriented. Well appearing  and in no acute distress. Eyes: Conjunctivae are normal. PERRL. EOMI. Head: Atraumatic. Nose: Mild congestion/no rhinnorhea. Mouth/Throat: Mucous membranes are moist.  Oropharynx non-erythematous. Neck: No stridor.   Hematological/Lymphatic/Immunilogical: No cervical lymphadenopathy. Cardiovascular: Normal rate, regular rhythm. Grossly normal heart sounds.  Good peripheral circulation. Respiratory: Normal respiratory effort.  No retractions. Lungs CTAB.  Patient has a very bronchitic cough when she is coughing and was noted during exam. Gastrointestinal: Soft and nontender. No distention.  Neurologic:  Normal speech and language. No gross focal neurologic deficits are appreciated. No gait  instability. Skin:  Skin is warm, dry and intact. No rash noted. Psychiatric: Mood and affect are normal. Speech and behavior are normal.  ____________________________________________   LABS (all labs ordered are listed, but only abnormal results are displayed)  Labs Reviewed - No data to display  RADIOLOGY   Official radiology report(s): DG Chest 2 View  Result Date: 04/09/2020 CLINICAL DATA:  Cough EXAM: CHEST - 2 VIEW COMPARISON:  October 09, 2016 FINDINGS: There is chronic reticulonodular interstitial thickening bilaterally, more notable on the right than on the left. There is no appreciable edema or airspace opacity. The heart size and pulmonary vascularity are normal. No adenopathy. No bone lesions. IMPRESSION: Reticulonodular opacities bilaterally, more on the right than on the left, are stable compared to the 2017 study. Suspect residua of chronic bronchitis with post inflammatory type change. Lungs elsewhere clear. Cardiac silhouette normal. No adenopathy. Electronically Signed   By: Lowella Grip III M.D.   On: 04/09/2020 08:59    ____________________________________________   PROCEDURES  Procedure(s) performed (including Critical Care):  Procedures   ____________________________________________   INITIAL IMPRESSION / ASSESSMENT AND PLAN / ED COURSE  As part of my medical decision making, I reviewed the following data within the electronic MEDICAL RECORD NUMBER Notes from prior ED visits and [place] was evaluated in Emergency Department on 04/09/2020 for the symptoms described in the history of present illness. She was evaluated in the context of the global COVID-19 pandemic, which necessitated consideration that the patient might be at risk for infection with the SARS-CoV-2 virus that causes COVID-19. Institutional protocols and algorithms that pertain to the evaluation of patients at risk for COVID-19 are in a state of rapid  change based on information released by regulatory bodies including the CDC and federal and state organizations. These policies and algorithms were followed during the patient's care in the ED.  39 year old female presents to the ED with complaint of cough that has lasted intermittently for several months.  Patient is a former smoker but discontinued smoking years ago.  No history of asthma but positive history for bronchitis and pneumonia.  Physical exam is suspicious for bronchitis.  Chest x-ray was negative for pneumonia.  Patient was discharged with a prescription for a Z-Pak, prednisone taper and an albuterol inhaler.  Patient has in the past used an inhaler and states she knows how to use 1.  She is to follow-up with her primary care provider if any continued problems.  She is to return to the emergency department if any severe worsening of her breathing or shortness of breath.  ____________________________________________   FINAL CLINICAL IMPRESSION(S) / ED DIAGNOSES  Final diagnoses:  Acute bronchitis, unspecified organism     ED Discharge Orders         Ordered    azithromycin (ZITHROMAX Z-PAK) 250 MG tablet     Discontinue  Reprint     04/09/20 3382  predniSONE (DELTASONE) 10 MG tablet     Discontinue  Reprint     04/09/20 0909    albuterol (VENTOLIN HFA) 108 (90 Base) MCG/ACT inhaler  Every 6 hours PRN     Discontinue  Reprint     04/09/20 3086           Note:  This document was prepared using Dragon voice recognition software and may include unintentional dictation errors.    Tommi Rumps, PA-C 04/09/20 1253    Jene Every, MD 04/09/20 1302

## 2020-04-09 NOTE — Discharge Instructions (Signed)
Follow-up with your primary care provider or [facility] if any continued problems.  Return to the emergency department if any severe worsening of your symptoms such as difficulty breathing or shortness of breath.  A prescription for Zithromax, prednisone and albuterol inhaler was sent to your pharmacy.  Begin using all 3 of these medications.  Increase fluids to stay hydrated.

## 2020-04-09 NOTE — ED Notes (Signed)
First Nurse note  States she developed body aches,cough about 1 week ago   Was tested times 2 for COVID which were negative   Cough has been getting worse

## 2020-04-09 NOTE — ED Triage Notes (Signed)
Pt to ER with c/o cough, congestion, runny/stuffy nose. Pt states has had two covid tests both negative.

## 2020-11-03 ENCOUNTER — Emergency Department
Admission: EM | Admit: 2020-11-03 | Discharge: 2020-11-03 | Disposition: A | Discharge status: Home / Self Care | Payer: HRSA Program | Attending: Emergency Medicine | Admitting: Emergency Medicine

## 2020-11-03 ENCOUNTER — Encounter: Payer: Self-pay | Admitting: Emergency Medicine

## 2020-11-03 ENCOUNTER — Emergency Department: Payer: HRSA Program

## 2020-11-03 ENCOUNTER — Other Ambulatory Visit: Payer: Self-pay

## 2020-11-03 DIAGNOSIS — U071 COVID-19: Secondary | ICD-10-CM | POA: Diagnosis not present

## 2020-11-03 DIAGNOSIS — B349 Viral infection, unspecified: Secondary | ICD-10-CM | POA: Insufficient documentation

## 2020-11-03 DIAGNOSIS — Z87891 Personal history of nicotine dependence: Secondary | ICD-10-CM | POA: Insufficient documentation

## 2020-11-03 DIAGNOSIS — R059 Cough, unspecified: Secondary | ICD-10-CM | POA: Diagnosis present

## 2020-11-03 DIAGNOSIS — Z20822 Contact with and (suspected) exposure to covid-19: Secondary | ICD-10-CM

## 2020-11-03 LAB — SARS CORONAVIRUS 2 (TAT 6-24 HRS): SARS Coronavirus 2: POSITIVE — AB

## 2020-11-03 MED ORDER — ALBUTEROL SULFATE HFA 108 (90 BASE) MCG/ACT IN AERS
2.0000 | INHALATION_SPRAY | Freq: Four times a day (QID) | RESPIRATORY_TRACT | 0 refills | Status: DC | PRN
Start: 2020-11-03 — End: 2022-10-06

## 2020-11-03 MED ORDER — ONDANSETRON 4 MG PO TBDP: 4.0000 mg | ORAL_TABLET | Freq: Three times a day (TID) | ORAL | 0 refills | Status: DC | PRN

## 2020-11-03 NOTE — ED Provider Notes (Signed)
[facility] Emergency Department Provider Note   ____________________________________________   Event Date/Time   First MD Initiated Contact with Patient 11/03/20 0424     (approximate)  I have reviewed the triage vital signs and the nursing notes.   HISTORY  Chief Complaint Cough    HPI Karen Harrington is a 40 y.o. female with no significant past medical history who presents to the ED complaining of cough.  Patient reports that a couple of days ago she developed a nonproductive cough along with fatigue, malaise, and subjective fevers with chills.  She recently found out she was exposed to COVID-19 at work and is concerned she could have caught it.  She is unvaccinated against Covid.  She denies any chest pain or shortness of breath, has not had any vomiting or diarrhea.        History reviewed. No pertinent past medical history.  There are no problems to display for this patient.   History reviewed. No pertinent surgical history.  Prior to Admission medications   Medication Sig Start Date End Date Taking? Authorizing Provider  albuterol (VENTOLIN HFA) 108 (90 Base) MCG/ACT inhaler Inhale 2 puffs into the lungs every 6 (six) hours as needed for wheezing or shortness of breath. 11/03/20  Yes Chesley Noon, MD  ondansetron (ZOFRAN ODT) 4 MG disintegrating tablet Take 1 tablet (4 mg total) by mouth every 8 (eight) hours as needed for nausea or vomiting. 11/03/20  Yes Chesley Noon, MD  predniSONE (DELTASONE) 10 MG tablet Take 6 tablets  today, on day 2 take 5 tablets, day 3 take 4 tablets, day 4 take 3 tablets, day 5 take  2 tablets and 1 tablet the last day 04/09/20   Tommi Rumps, PA-C    Allergies Patient has no known allergies.  No family history on file.  Social History Social History   Tobacco Use  . Smoking status: Former Smoker    Quit date: 10/28/2004    Years since quitting: 16.0  . Smokeless tobacco: Never Used  Substance Use  Topics  . Alcohol use: No  . Drug use: Never    Review of Systems  Constitutional: No fever/chills.  Positive for generalized weakness and malaise. Eyes: No visual changes. ENT: No sore throat. Cardiovascular: Denies chest pain. Respiratory: Denies shortness of breath.  Positive for cough. Gastrointestinal: No abdominal pain.  No nausea, no vomiting.  No diarrhea.  No constipation. Genitourinary: Negative for dysuria. Musculoskeletal: Negative for back pain. Skin: Negative for rash. Neurological: Negative for headaches, focal weakness or numbness.  ____________________________________________   PHYSICAL EXAM:  VITAL SIGNS: ED Triage Vitals [11/03/20 0211]  Enc Vitals Group     BP 127/68     Pulse Rate 93     Resp 18     Temp 98.4 F (36.9 C)     Temp Source Oral     SpO2 97 %     Weight 220 lb (99.8 kg)     Height 5\' 7"  (1.702 m)     Head Circumference      Peak Flow      Pain Score 0     Pain Loc      Pain Edu?      Excl. in GC?     Constitutional: Alert and oriented. Eyes: Conjunctivae are normal. Head: Atraumatic. Nose: No congestion/rhinnorhea. Mouth/Throat: Mucous membranes are moist. Neck: Normal ROM Cardiovascular: Normal rate, regular rhythm. Grossly normal heart sounds. Respiratory: Normal respiratory effort.  No retractions.  Lungs CTAB. Gastrointestinal: Soft and nontender. No distention. Genitourinary: deferred Musculoskeletal: No lower extremity tenderness nor edema. Neurologic:  Normal speech and language. No gross focal neurologic deficits are appreciated. Skin:  Skin is warm, dry and intact. No rash noted. Psychiatric: Mood and affect are normal. Speech and behavior are normal.  ____________________________________________   LABS (all labs ordered are listed, but only abnormal results are displayed)  Labs Reviewed  SARS CORONAVIRUS 2 (TAT 6-24 HRS)     PROCEDURES  Procedure(s) performed (including Critical  Care):  Procedures   ____________________________________________   INITIAL IMPRESSION / ASSESSMENT AND PLAN / ED COURSE       40 year old female with no significant past medical history who presents to the ED complaining of cough, generalized weakness, and malaise for the past couple of days.  She is not in any respiratory distress and is maintaining O2 sats on room air.  Chest x-ray reviewed by me and shows no infiltrate, edema, or effusion.  Symptoms sound consistent with viral syndrome, most likely COVID-19.  Testing for COVID-19 was performed and pending, but patient is appropriate for discharge home with PCP follow-up.  She was prescribed Zofran and albuterol for symptomatic management and counseled to return to the ED for new worsening symptoms.  Patient agrees with plan.      ____________________________________________   FINAL CLINICAL IMPRESSION(S) / ED DIAGNOSES  Final diagnoses:  Viral syndrome  Suspected COVID-19 virus infection     ED Discharge Orders         Ordered    albuterol (VENTOLIN HFA) 108 (90 Base) MCG/ACT inhaler  Every 6 hours PRN       Note to Pharmacy: Please supply with spacer   11/03/20 0435    ondansetron (ZOFRAN ODT) 4 MG disintegrating tablet  Every 8 hours PRN        11/03/20 0435           Note:  This document was prepared using Dragon voice recognition software and may include unintentional dictation errors.   Chesley Noon, MD 11/03/20 [phone]

## 2020-11-03 NOTE — ED Triage Notes (Signed)
Patient ambulatory to triage with steady gait, without difficulty or distress noted; pt reports since yesterday having cough and congestion

## 2020-11-04 ENCOUNTER — Other Ambulatory Visit: Payer: Self-pay

## 2021-04-15 ENCOUNTER — Emergency Department: Payer: Self-pay

## 2021-04-15 ENCOUNTER — Other Ambulatory Visit: Payer: Self-pay

## 2021-04-15 ENCOUNTER — Emergency Department
Admission: EM | Admit: 2021-04-15 | Discharge: 2021-04-15 | Disposition: A | Discharge status: Home / Self Care | Payer: Self-pay | Attending: Emergency Medicine | Admitting: Emergency Medicine

## 2021-04-15 DIAGNOSIS — R110 Nausea: Secondary | ICD-10-CM | POA: Insufficient documentation

## 2021-04-15 DIAGNOSIS — Z87891 Personal history of nicotine dependence: Secondary | ICD-10-CM | POA: Insufficient documentation

## 2021-04-15 DIAGNOSIS — R002 Palpitations: Secondary | ICD-10-CM | POA: Insufficient documentation

## 2021-04-15 DIAGNOSIS — R109 Unspecified abdominal pain: Secondary | ICD-10-CM | POA: Insufficient documentation

## 2021-04-15 LAB — BASIC METABOLIC PANEL
Anion gap: 6 (ref 5–15)
BUN: 10 mg/dL (ref 6–20)
CO2: 33 mmol/L — ABNORMAL HIGH (ref 22–32)
Calcium: 9.2 mg/dL (ref 8.9–10.3)
Chloride: 100 mmol/L (ref 98–111)
Creatinine, Ser: 0.48 mg/dL (ref 0.44–1.00)
GFR, Estimated: >60 mL/min (ref >60)
Glucose, Bld: 123 mg/dL — ABNORMAL HIGH (ref 70–99)
Potassium: 3.8 mmol/L (ref 3.5–5.1)
Sodium: 139 mmol/L (ref 135–145)

## 2021-04-15 LAB — CBC
HCT: 44.4 % (ref 36.0–46.0)
Hemoglobin: 13.5 g/dL (ref 12.0–15.0)
MCH: 27.7 pg (ref 26.0–34.0)
MCHC: 30.4 g/dL (ref 30.0–36.0)
MCV: 91.2 fL (ref 80.0–100.0)
Platelets: 229 10*3/uL (ref 150–400)
RBC: 4.87 MIL/uL (ref 3.87–5.11)
RDW: 13.2 % (ref 11.5–15.5)
WBC: 10.1 10*3/uL (ref 4.0–10.5)
nRBC: 0 % (ref 0.0–0.2)

## 2021-04-15 LAB — URINALYSIS, COMPLETE (UACMP) WITH MICROSCOPIC
Bilirubin Urine: NEGATIVE
Glucose, UA: NEGATIVE mg/dL
Hgb urine dipstick: NEGATIVE
Ketones, ur: 5 mg/dL — AB
Leukocytes,Ua: NEGATIVE
Nitrite: NEGATIVE
Protein, ur: 30 mg/dL — AB
Specific Gravity, Urine: 1.035 — ABNORMAL HIGH (ref 1.005–1.030)
pH: 5 (ref 5.0–8.0)

## 2021-04-15 LAB — POC URINE PREG, ED: Preg Test, Ur: NEGATIVE

## 2021-04-15 MED ORDER — LIDOCAINE 5 % EX PTCH: 1.0000 | MEDICATED_PATCH | Freq: Two times a day (BID) | CUTANEOUS | 0 refills | Status: DC

## 2021-04-15 MED ORDER — ACYCLOVIR 400 MG PO TABS: 400.0000 mg | ORAL_TABLET | Freq: Every day | ORAL | 0 refills | Status: AC

## 2021-04-15 MED ORDER — ONDANSETRON 4 MG PO TBDP: 4.0000 mg | ORAL_TABLET | Freq: Three times a day (TID) | ORAL | 0 refills | Status: DC | PRN

## 2021-04-15 MED ORDER — OXYCODONE-ACETAMINOPHEN 5-325 MG PO TABS
1.0000 | ORAL_TABLET | ORAL | Status: DC | PRN
  Administered 2021-04-15: 04:00:00 1 via ORAL
  Filled 2021-04-15: qty 1

## 2021-04-15 NOTE — ED Notes (Signed)
Pt to restroom to attempt to void.

## 2021-04-15 NOTE — ED Triage Notes (Signed)
Pt states left flank pain that began at 0200. Pt states history of kidney stones. Pt states pain feels similar to previous pain. Pt took 4 ibuprofen pta.

## 2021-04-15 NOTE — ED Provider Notes (Signed)
[facility] Emergency Department Provider Note   ____________________________________________   None    (approximate)  I have reviewed the triage vital signs and the nursing notes.   HISTORY  Chief Complaint Flank Pain    HPI Karen Harrington is a 40 y.o. female with past medical history of kidney stones who presents to the ED complaining of flank pain.  Patient reports acute onset of pain in her left flank around 2:00 this morning.  She describes it as a constant sharp pain that is worse with certain positions and movement.  She states that her left flank area has been tender to palpation and that current symptoms feel similar to prior kidney stones.  She reports nausea but has not vomited and denies any changes in her bowel movements.  She has not had any fevers, dysuria, or pain in her abdomen.  She has not noticed any rashes to her left flank.        No past medical history on file.  There are no problems to display for this patient.   No past surgical history on file.  Prior to Admission medications   Medication Sig Start Date End Date Taking? Authorizing Provider  acyclovir (ZOVIRAX) 400 MG tablet Take 1 tablet (400 mg total) by mouth 5 (five) times daily for 7 days. 04/15/21 04/22/21 Yes Chesley Noon, MD  lidocaine (LIDODERM) 5 % Place 1 patch onto the skin every 12 (twelve) hours. Remove & Discard patch within 12 hours or as directed by MD 04/15/21 04/15/22 Yes Chesley Noon, MD  ondansetron (ZOFRAN ODT) 4 MG disintegrating tablet Take 1 tablet (4 mg total) by mouth every 8 (eight) hours as needed for nausea or vomiting. 04/15/21  Yes Chesley Noon, MD  albuterol (VENTOLIN HFA) 108 (90 Base) MCG/ACT inhaler Inhale 2 puffs into the lungs every 6 (six) hours as needed for wheezing or shortness of breath. 11/03/20   Chesley Noon, MD  predniSONE (DELTASONE) 10 MG tablet Take 6 tablets  today, on day 2 take 5 tablets, day 3 take 4 tablets, day 4 take  3 tablets, day 5 take  2 tablets and 1 tablet the last day 04/09/20   Tommi Rumps, PA-C    Allergies Patient has no known allergies.  No family history on file.  Social History Social History   Tobacco Use   Smoking status: Former    Pack years: 0.00    Types: Cigarettes    Quit date: 10/28/2004    Years since quitting: 16.4   Smokeless tobacco: Never  Substance Use Topics   Alcohol use: No   Drug use: Never    Review of Systems  Constitutional: No fever/chills Eyes: No visual changes. ENT: No sore throat. Cardiovascular: Denies chest pain. Respiratory: Denies shortness of breath. Gastrointestinal: No abdominal pain.  Positive for flank pain and nausea, no vomiting.  No diarrhea.  No constipation. Genitourinary: Negative for dysuria. Musculoskeletal: Negative for back pain. Skin: Negative for rash. Neurological: Negative for headaches, focal weakness or numbness.  ____________________________________________   PHYSICAL EXAM:  VITAL SIGNS: ED Triage Vitals  Enc Vitals Group     BP 04/15/21 0346 139/81     Pulse Rate 04/15/21 0346 92     Resp 04/15/21 0346 16     Temp 04/15/21 0346 98.4 F (36.9 C)     Temp Source 04/15/21 0346 Oral     SpO2 04/15/21 0346 100 %     Weight 04/15/21 0347 240 lb (108.9  kg)     Height 04/15/21 0347 5\' 7"  (1.702 m)     Head Circumference --      Peak Flow --      Pain Score 04/15/21 0346 10     Pain Loc --      Pain Edu? --      Excl. in GC? --     Constitutional: Alert and oriented. Eyes: Conjunctivae are normal. Head: Atraumatic. Nose: No congestion/rhinnorhea. Mouth/Throat: Mucous membranes are moist. Neck: Normal ROM Cardiovascular: Normal rate, regular rhythm. Grossly normal heart sounds. Respiratory: Normal respiratory effort.  No retractions. Lungs CTAB. Gastrointestinal: Soft and nontender.  Tenderness to palpation in the area of left flank.  No distention. Genitourinary: deferred Musculoskeletal: No lower  extremity tenderness nor edema. Neurologic:  Normal speech and language. No gross focal neurologic deficits are appreciated. Skin:  Skin is warm, dry and intact. No rash noted. Psychiatric: Mood and affect are normal. Speech and behavior are normal.  ____________________________________________   LABS (all labs ordered are listed, but only abnormal results are displayed)  Labs Reviewed  URINALYSIS, COMPLETE (UACMP) WITH MICROSCOPIC - Abnormal; Notable for the following components:      Result Value   Color, Urine YELLOW (*)    APPearance HAZY (*)    Specific Gravity, Urine 1.035 (*)    Ketones, ur 5 (*)    Protein, ur 30 (*)    Bacteria, UA RARE (*)    All other components within normal limits  BASIC METABOLIC PANEL - Abnormal; Notable for the following components:   CO2 33 (*)    Glucose, Bld 123 (*)    All other components within normal limits  CBC  POC URINE PREG, ED    PROCEDURES  Procedure(s) performed (including Critical Care):  Procedures   ____________________________________________   INITIAL IMPRESSION / ASSESSMENT AND PLAN / ED COURSE      40 year old female with past medical history of kidney stones who presents to the ED with acute onset left flank pain starting around 2:00 this morning.  Pain is reproducible with palpation around her left flank area but she has no abdominal tenderness.  Labs are unremarkable, UA does not appear consistent with infection.  CT scan was performed from triage and is negative for obstructing urinary stone or other acute process to explain her pain.  Given reproduction of her pain with palpation, suspect symptoms are due to musculoskeletal source versus shingles that has not yet developed rash.  Patient declined Toradol and states that nausea is improved.  She is appropriate for discharge home with PCP follow-up, will be prescribed Lidoderm patches and acyclovir, counseled to fill this prescription if she develops a shingles-like  rash.  Patient counseled to return to the ED for new worsening symptoms, patient agrees with plan.      ____________________________________________   FINAL CLINICAL IMPRESSION(S) / ED DIAGNOSES  Final diagnoses:  Left flank pain     ED Discharge Orders          Ordered    lidocaine (LIDODERM) 5 %  Every 12 hours        04/15/21 1041    ondansetron (ZOFRAN ODT) 4 MG disintegrating tablet  Every 8 hours PRN        04/15/21 1041    acyclovir (ZOVIRAX) 400 MG tablet  5 times daily        04/15/21 1041             Note:  This document was prepared using  [employer] and may include unintentional dictation errors.    Chesley Noon, MD 04/15/21 [phone]

## 2021-05-31 ENCOUNTER — Emergency Department: Payer: Self-pay

## 2021-05-31 ENCOUNTER — Encounter: Payer: Self-pay | Admitting: *Deleted

## 2021-05-31 ENCOUNTER — Emergency Department
Admission: EM | Admit: 2021-05-31 | Discharge: 2021-05-31 | Disposition: A | Discharge status: Home / Self Care | Payer: Self-pay | Attending: Emergency Medicine | Admitting: Emergency Medicine

## 2021-05-31 ENCOUNTER — Other Ambulatory Visit: Payer: Self-pay

## 2021-05-31 DIAGNOSIS — M79644 Pain in right finger(s): Secondary | ICD-10-CM | POA: Insufficient documentation

## 2021-05-31 DIAGNOSIS — W010XXA Fall on same level from slipping, tripping and stumbling without subsequent striking against object, initial encounter: Secondary | ICD-10-CM | POA: Insufficient documentation

## 2021-05-31 DIAGNOSIS — S63681A Other sprain of right thumb, initial encounter: Secondary | ICD-10-CM | POA: Insufficient documentation

## 2021-05-31 DIAGNOSIS — Z87891 Personal history of nicotine dependence: Secondary | ICD-10-CM | POA: Insufficient documentation

## 2021-05-31 NOTE — ED Triage Notes (Signed)
Pt slipped and fell in water.  Pt has right thumb pain. And swelling  pt alert.

## 2021-05-31 NOTE — ED Provider Notes (Signed)
[facility] EMERGENCY DEPARTMENT Provider Note   CSN: [id number] Arrival date & time: 05/31/21  2056     History Chief Complaint  Patient presents with   Finger Injury    Karen Harrington is a 40 y.o. female presents to the emergency department evaluation of right thumb pain.  She slipped and fell on a rug earlier today and injured her right thumb.  She complains of pain along the ulnar collateral ligament.  She denies any other injury to her body.  No numbness or tingling.  No pain along the snuffbox.  No pain in the wrist.  She has not take any medications for pain and states she does not anything at this time.  HPI     No past medical history on file.  There are no problems to display for this patient.   No past surgical history on file.   OB History   No obstetric history on file.     No family history on file.  Social History   Tobacco Use   Smoking status: Former    Types: Cigarettes    Quit date: 10/28/2004    Years since quitting: 16.6   Smokeless tobacco: Never  Substance Use Topics   Alcohol use: No   Drug use: Never    Home Medications Prior to Admission medications   Medication Sig Start Date End Date Taking? Authorizing Provider  albuterol (VENTOLIN HFA) 108 (90 Base) MCG/ACT inhaler Inhale 2 puffs into the lungs every 6 (six) hours as needed for wheezing or shortness of breath. 11/03/20   Chesley Noon, MD  lidocaine (LIDODERM) 5 % Place 1 patch onto the skin every 12 (twelve) hours. Remove & Discard patch within 12 hours or as directed by MD 04/15/21 04/15/22  Chesley Noon, MD  ondansetron (ZOFRAN ODT) 4 MG disintegrating tablet Take 1 tablet (4 mg total) by mouth every 8 (eight) hours as needed for nausea or vomiting. 04/15/21   Chesley Noon, MD  predniSONE (DELTASONE) 10 MG tablet Take 6 tablets  today, on day 2 take 5 tablets, day 3 take 4 tablets, day 4 take 3 tablets, day 5 take  2 tablets and 1 tablet the last day 04/09/20    Tommi Rumps, PA-C    Allergies    Patient has no known allergies.  Review of Systems   Review of Systems  Musculoskeletal:  Positive for arthralgias.  Skin:  Negative for wound.  Neurological:  Negative for headaches.   Physical Exam Updated Vital Signs BP (!) 138/98 (BP Location: Left Arm)   Pulse 94   Temp 99.1 F (37.3 C) (Oral)   Resp 18   Ht 5\' 7"  (1.702 m)   Wt 111.1 kg   LMP  (LMP Unknown)   SpO2 96%   BMI 38.37 kg/m   Physical Exam Constitutional:      Appearance: She is well-developed.  HENT:     Head: Normocephalic and atraumatic.  Eyes:     Conjunctiva/sclera: Conjunctivae normal.  Cardiovascular:     Rate and Rhythm: Normal rate.  Pulmonary:     Effort: Pulmonary effort is normal. No respiratory distress.  Musculoskeletal:        General: Normal range of motion.     Cervical back: Normal range of motion.     Comments: Right thumb swollen, severely tender along ulnar collateral ligament but she does not allow me to stress this joint due to pain.  No deformity.  No significant tenderness  along the snuffbox, wrist or radial styloid.  No catching or locking  Skin:    General: Skin is warm.     Findings: No rash.  Neurological:     Mental Status: She is alert and oriented to person, place, and time.  Psychiatric:        Behavior: Behavior normal.        Thought Content: Thought content normal.    ED Results / Procedures / Treatments   Labs (all labs ordered are listed, but only abnormal results are displayed) Labs Reviewed - No data to display  EKG None  Radiology DG Finger Thumb Right  Result Date: 05/31/2021 CLINICAL DATA:  Status post fall with RIGHT thumb pain. EXAM: RIGHT THUMB 2+V COMPARISON:  None FINDINGS: No sign of acute fracture or dislocation involving the RIGHT thumb. Soft tissues with mild soft tissue swelling. IMPRESSION: Mild soft tissue swelling without acute fracture. Electronically Signed   By: Donzetta Kohut M.D.   On:  05/31/2021 21:19    Procedures Procedures   Medications Ordered in ED Medications - No data to display  ED Course  I have reviewed the triage vital signs and the nursing notes.  Pertinent labs & imaging results that were available during my care of the patient were reviewed by me and considered in my medical decision making (see chart for details).    MDM Rules/Calculators/A&P                         40 year old female with right thumb injury.  Suspect sprain with concern for injury to the ulnar collateral ligament, she is unable to allow me to perform physical exam distresses ligament but she is placed into a thumb spica splint and encouraged to ice, take Tylenol and ibuprofen and follow-up with orthopedics.  She does not report any pain along the snuffbox and has no pain to palpation of this area.  X-rays of the thumb negative, scaphoid visualized and no definite fracture there.  Recommend she follow-up with orthopedics next week for recheck and wear thumb spica's brace at all times except for showering and icing. Final Clinical Impression(s) / ED Diagnoses Final diagnoses:  Other sprain of right thumb, initial encounter    Rx / DC Orders ED Discharge Orders     None        Ronnette Juniper 05/31/21 2206    Sharyn Creamer, MD 06/01/21 2121

## 2021-05-31 NOTE — Discharge Instructions (Addendum)
Please call orthopedic office to schedule follow-up appointment for next week.  Wear brace at all times except for showering.  Take Tylenol and ibuprofen as needed for pain.

## 2021-05-31 NOTE — ED Notes (Addendum)
Patient reports she tripped and fell, landing on her right hand, causing pain to her right thumb. Patient is noted to have mild swelling. Ice applied. CMS intact.

## 2021-11-20 ENCOUNTER — Other Ambulatory Visit: Payer: Self-pay

## 2021-11-20 ENCOUNTER — Encounter: Payer: Self-pay | Admitting: Emergency Medicine

## 2021-11-20 ENCOUNTER — Emergency Department
Admission: EM | Admit: 2021-11-20 | Discharge: 2021-11-20 | Disposition: A | Discharge status: Home / Self Care | Payer: Self-pay | Attending: Emergency Medicine | Admitting: Emergency Medicine

## 2021-11-20 DIAGNOSIS — U071 COVID-19: Secondary | ICD-10-CM | POA: Insufficient documentation

## 2021-11-20 DIAGNOSIS — Z28310 Unvaccinated for covid-19: Secondary | ICD-10-CM | POA: Insufficient documentation

## 2021-11-20 DIAGNOSIS — F17210 Nicotine dependence, cigarettes, uncomplicated: Secondary | ICD-10-CM | POA: Insufficient documentation

## 2021-11-20 LAB — RESP PANEL BY RT-PCR (FLU A&B, COVID) ARPGX2
Influenza A by PCR: NEGATIVE
Influenza B by PCR: NEGATIVE
SARS Coronavirus 2 by RT PCR: POSITIVE — AB

## 2021-11-20 MED ORDER — ONDANSETRON 4 MG PO TBDP: 4.0000 mg | ORAL_TABLET | Freq: Three times a day (TID) | ORAL | 0 refills | Status: DC | PRN

## 2021-11-20 MED ORDER — ACETAMINOPHEN 500 MG PO TABS
1000.0000 mg | ORAL_TABLET | Freq: Once | ORAL | Status: AC
  Administered 2021-11-20: 11:00:00 1000 mg via ORAL
  Filled 2021-11-20: qty 2

## 2021-11-20 MED ORDER — PSEUDOEPH-BROMPHEN-DM 30-2-10 MG/5ML PO SYRP: 5.0000 mL | ORAL_SOLUTION | Freq: Four times a day (QID) | ORAL | 0 refills | Status: DC | PRN

## 2021-11-20 MED ORDER — ONDANSETRON 4 MG PO TBDP
4.0000 mg | ORAL_TABLET | Freq: Once | ORAL | Status: AC
  Administered 2021-11-20: 11:00:00 4 mg via ORAL
  Filled 2021-11-20: qty 1

## 2021-11-20 NOTE — Discharge Instructions (Addendum)
Follow-up with your primary care provider if any continued problems or concerns.  Drink lots of fluids to stay hydrated.  Tylenol or ibuprofen as needed for body aches, headache or fever.  A prescription for Bromfed-DM was sent to your pharmacy to help with congestion and cough and Zofran if needed for nausea and vomiting..  Discontinue smoking at this time.  Return to the emergency department immediately if any difficulty breathing or shortness of breath.  As discussed earlier you will need to quarantine for a total of 10 days along with your family as you are highly contagious.

## 2021-11-20 NOTE — ED Notes (Signed)
See triage note  presents with low grade fever and body aches   sxs' started last pm  also has had cough

## 2021-11-20 NOTE — ED Provider Notes (Addendum)
[facility] Provider Note    Event Date/Time   First MD Initiated Contact with Patient 11/20/21 0845     (approximate)   History   Cough   HPI  Karen Harrington is a 41 y.o. female presents to the ED with complaint of chills, body aches, congestion, headache and generalized weakness that started last evening.  Patient is not vaccinated for COVID.  She is also here with her daughter with similar symptoms to be seen as well.  Patient occasionally smokes cigarettes.  She denies any medical issues.     Physical Exam   Triage Vital Signs: ED Triage Vitals [11/20/21 0845]  Enc Vitals Group     BP      Pulse      Resp      Temp      Temp src      SpO2      Weight      Height      Head Circumference      Peak Flow      Pain Score 8     Pain Loc      Pain Edu?      Excl. in GC?     Most recent vital signs: Vitals:   11/20/21 0847  BP: (!) 127/91  Pulse: (!) 130  Resp: 18  Temp: (!) 100.5 F (38.1 C)  SpO2: 99%     General: Awake, no distress.  CV:  Good peripheral perfusion.  Heart regular rate and rhythm without murmur. Resp:  Normal effort.  Lungs are clear bilaterally. Abd:  No distention.  Bowel sounds normoactive x4 quadrants.    ED Results / Procedures / Treatments   Labs (all labs ordered are listed, but only abnormal results are displayed) Labs Reviewed  RESP PANEL BY RT-PCR (FLU A&B, COVID) ARPGX2 - Abnormal; Notable for the following components:      Result Value   SARS Coronavirus 2 by RT PCR POSITIVE (*)    All other components within normal limits      PROCEDURES:  Critical Care performed: No  Procedures   MEDICATIONS ORDERED IN ED: Medications  acetaminophen (TYLENOL) tablet 1,000 mg (1,000 mg Oral Given 11/20/21 1038)  ondansetron (ZOFRAN-ODT) disintegrating tablet 4 mg (4 mg Oral Given 11/20/21 1053)     IMPRESSION / MDM / ASSESSMENT AND PLAN / ED COURSE  I reviewed the triage vital signs and the  nursing notes.   Differential diagnosis includes, but is not limited to, URI with cough, viral syndrome, influenza, COVID.  41 year old female presents to the ED with complaint of chills, body aches, congestion, headache and weakness that started last evening.  She is also here with her 69 year old daughter with similar symptoms.  Patient has not been vaccinated against COVID.  She occasionally's smokes cigarettes but denies any other health problems.  Nasal swab results were reviewed by myself and was positive for COVID.  Patient was made aware.  Because she has not been vaccinated a note was written for her to remain out of work for 10 days and quarantine at home.  We discussed staying hydrated with drinking lots of fluids, Tylenol or ibuprofen as needed for body aches, fever or headache.  A prescription for Bromfed-DM was sent to the pharmacy.  Patient is aware that this is highly contagious.  Just prior to discharge patient had 1 episode of vomiting and was given a Zofran and felt much better.  A prescription for Bromfed and  Zofran was sent to her pharmacy and she is to follow-up with her PCP if any continued problems.  She is return to the emergency department immediately if she develops any shortness of breath or difficulty breathing due to her COVID.  Vital signs were stable and patient's O2 sat was 99%.   Clinical Course as of 11/20/21 1529  Tue Nov 20, 2021  0848 Resp Panel by RT-PCR (Flu A&B, Covid) Nasopharyngeal Swab [RS]    Clinical Course User Index [RS] Tommi Rumps, PA-C     FINAL CLINICAL IMPRESSION(S) / ED DIAGNOSES   Final diagnoses:  COVID     Rx / DC Orders   ED Discharge Orders          Ordered    brompheniramine-pseudoephedrine-DM 30-2-10 MG/5ML syrup  4 times daily PRN        11/20/21 1037    ondansetron (ZOFRAN-ODT) 4 MG disintegrating tablet  Every 8 hours PRN        11/20/21 1053             Note:  This document was prepared using Dragon voice  recognition software and may include unintentional dictation errors.   Tommi Rumps, PA-C 11/20/21 1528    Tommi Rumps, PA-C 11/20/21 1529    Concha Se, MD 11/21/21 [phone]

## 2021-11-20 NOTE — ED Triage Notes (Signed)
Pt here with chills, body aches, congestion, headache, and weakness that started last night. Pt in NAD in triage.

## 2022-09-09 ENCOUNTER — Other Ambulatory Visit: Payer: Self-pay

## 2022-09-09 ENCOUNTER — Emergency Department: Payer: Medicaid Other

## 2022-09-09 ENCOUNTER — Emergency Department
Admission: EM | Admit: 2022-09-09 | Discharge: 2022-09-09 | Disposition: A | Discharge status: Home / Self Care | Payer: Self-pay | Attending: Emergency Medicine | Admitting: Emergency Medicine

## 2022-09-09 ENCOUNTER — Emergency Department: Payer: Self-pay

## 2022-09-09 ENCOUNTER — Encounter: Payer: Self-pay | Admitting: Emergency Medicine

## 2022-09-09 DIAGNOSIS — M25461 Effusion, right knee: Secondary | ICD-10-CM

## 2022-09-09 DIAGNOSIS — M25561 Pain in right knee: Secondary | ICD-10-CM

## 2022-09-09 MED ORDER — KETOROLAC TROMETHAMINE 30 MG/ML IJ SOLN
30.0000 mg | Freq: Once | INTRAMUSCULAR | Status: AC
  Administered 2022-09-09: 30 mg via INTRAMUSCULAR
  Filled 2022-09-09: qty 1

## 2022-09-09 NOTE — ED Provider Notes (Signed)
[facility] Provider Note    Event Date/Time   First MD Initiated Contact with Patient 09/09/22 [phone]     (approximate)   History   Chief Complaint Leg Pain   HPI  Karen Harrington is a 41 y.o. female with no significant past medical history who presents to the ED complaining of leg pain.  Patient reports that she woke up this morning with pain starting in her right knee and shooting down her right leg.  She describes the pain as constant and sharp, worse if she tries to bend her right knee or bear weight on the right leg.  She denies any twisting or fall to her knee or leg, denies significant history of similar pain.  She has noticed some mild swelling around her knee and calf, denies any associated redness or drainage, has not had any fever.     Physical Exam   Triage Vital Signs: ED Triage Vitals  Enc Vitals Group     BP 09/09/22 0825 119/75     Pulse Rate 09/09/22 0825 84     Resp 09/09/22 0825 16     Temp 09/09/22 0825 98 F (36.7 C)     Temp Source 09/09/22 0825 Oral     SpO2 09/09/22 0825 98 %     Weight 09/09/22 0818 220 lb 0.3 oz (99.8 kg)     Height 09/09/22 0818 5\' 6"  (1.676 m)     Head Circumference --      Peak Flow --      Pain Score 09/09/22 0817 9     Pain Loc --      Pain Edu? --      Excl. in GC? --     Most recent vital signs: Vitals:   09/09/22 0825  BP: 119/75  Pulse: 84  Resp: 16  Temp: 98 F (36.7 C)  SpO2: 98%    Constitutional: Alert and oriented. Eyes: Conjunctivae are normal. Head: Atraumatic. Nose: No congestion/rhinnorhea. Mouth/Throat: Mucous membranes are moist.  Cardiovascular: Normal rate, regular rhythm. Grossly normal heart sounds.  2+ radial and DP pulses bilaterally. Respiratory: Normal respiratory effort.  No retractions. Lungs CTAB. Gastrointestinal: Soft and nontender. No distention. Musculoskeletal: Diffuse tenderness to palpation at right knee with no obvious deformity.  Pain upon range of  motion of right knee but patient able to range with discomfort.  Mild edema noted of right knee and calf with no erythema or warmth. Neurologic:  Normal speech and language. No gross focal neurologic deficits are appreciated.    ED Results / Procedures / Treatments   Labs (all labs ordered are listed, but only abnormal results are displayed) Labs Reviewed - No data to display  RADIOLOGY Right knee x-ray reviewed and interpreted by me with no fracture or dislocation.  PROCEDURES:  Critical Care performed: No  Procedures   MEDICATIONS ORDERED IN ED: Medications  ketorolac (TORADOL) 30 MG/ML injection 30 mg (30 mg Intramuscular Given 09/09/22 0926)     IMPRESSION / MDM / ASSESSMENT AND PLAN / ED COURSE  I reviewed the triage vital signs and the nursing notes.                              41 y.o. female with no significant past medical history presents to the ED with acute onset of right knee pain radiating down her right leg earlier this morning with no recent traumatic injury.  Patient's  presentation is most consistent with acute presentation with potential threat to life or bodily function.  Differential diagnosis includes, but is not limited to, septic arthritis, gouty arthritis, DVT, arterial insufficiency, venous insufficiency, osteoarthritis, fracture, ligamentous injury.  Patient nontoxic-appearing and in no acute distress, vital signs are unremarkable.  She is neurovascular intact to her distal right lower extremity with strong DP pulse.  No obvious signs of infection on exam and she is able to range her right knee, low suspicion for septic arthritis and exam does not appear concerning for gouty arthritis.  We will further assess with x-ray as well as ultrasound to rule out DVT.  Plan to treat symptomatically with IM Toradol, patient reports there is no way she could be pregnant as she is not currently sexually active.  Right knee x-ray is remarkable only for possible  small effusion, ultrasound shows no evidence of DVT or other acute process.  Patient reports improved pain following IM Toradol and she is appropriate for discharge home with orthopedic follow-up as needed.  She will be placed in a knee immobilizer for comfort, was counseled to bear weight as tolerated and to continue NSAIDs and Tylenol.  She was counseled to return to the ED for new or worsening symptoms, patient agrees with plan.      FINAL CLINICAL IMPRESSION(S) / ED DIAGNOSES   Final diagnoses:  Acute pain of right knee  Effusion of right knee     Rx / DC Orders   ED Discharge Orders     None        Note:  This document was prepared using Dragon voice recognition software and may include unintentional dictation errors.   Chesley Noon, MD 09/09/22 1113

## 2022-09-09 NOTE — ED Triage Notes (Signed)
C/O right knee pain.  States knee pain is anterior and posterior and pain shoots down leg through calf.  Denies injury.  States awoke this morning with symptoms.

## 2022-10-06 ENCOUNTER — Encounter: Payer: Self-pay | Admitting: Emergency Medicine

## 2022-10-06 ENCOUNTER — Ambulatory Visit
Admission: EM | Admit: 2022-10-06 | Discharge: 2022-10-06 | Disposition: A | Discharge status: Home / Self Care | Payer: Medicaid Other | Attending: Family Medicine | Admitting: Family Medicine

## 2022-10-06 DIAGNOSIS — J209 Acute bronchitis, unspecified: Secondary | ICD-10-CM

## 2022-10-06 DIAGNOSIS — H6691 Otitis media, unspecified, right ear: Secondary | ICD-10-CM

## 2022-10-06 MED ORDER — AMOXICILLIN-POT CLAVULANATE 875-125 MG PO TABS: 1.0000 | ORAL_TABLET | Freq: Two times a day (BID) | ORAL | 0 refills | Status: DC

## 2022-10-06 MED ORDER — PROMETHAZINE-DM 6.25-15 MG/5ML PO SYRP: 5.0000 mL | ORAL_SOLUTION | Freq: Four times a day (QID) | ORAL | 0 refills | Status: DC | PRN

## 2022-10-06 MED ORDER — PREDNISONE 50 MG PO TABS: ORAL_TABLET | ORAL | 0 refills | Status: DC

## 2022-10-06 MED ORDER — ALBUTEROL SULFATE HFA 108 (90 BASE) MCG/ACT IN AERS: 1.0000 | INHALATION_SPRAY | Freq: Four times a day (QID) | RESPIRATORY_TRACT | 0 refills | Status: DC | PRN

## 2022-10-06 NOTE — ED Triage Notes (Signed)
Patient c/o cough, chest congestion, runny nose, and headache that started a week ago.

## 2022-10-06 NOTE — ED Provider Notes (Signed)
[facility]    CSN: [id number] Arrival date & time: 10/06/22  0944      History   Chief Complaint Chief Complaint  Patient presents with   Cough    HPI 41 year old female who is a current smoker presents for evaluation of respiratory symptoms.  Has been sick since Monday.  Reports runny nose, cough, congestion.  Cough is her worst symptom and keeps her up at night.  Reports body aches.  No fever.  Has been using over-the-counter medication for cough without resolution.  No other associated symptoms.  No other complaints or  Home Medications    Prior to Admission medications   Medication Sig Start Date End Date Taking? Authorizing Provider  albuterol (VENTOLIN HFA) 108 (90 Base) MCG/ACT inhaler Inhale 1-2 puffs into the lungs every 6 (six) hours as needed for wheezing or shortness of breath. 10/06/22  Yes Maresha Anastos G, DO  amoxicillin-clavulanate (AUGMENTIN) 875-125 MG tablet Take 1 tablet by mouth every 12 (twelve) hours. 10/06/22  Yes Virginia Francisco G, DO  predniSONE (DELTASONE) 50 MG tablet 1 tablet daily x 5 days 10/06/22  Yes Corvette Orser G, DO  promethazine-dextromethorphan (PROMETHAZINE-DM) 6.25-15 MG/5ML syrup Take 5 mLs by mouth 4 (four) times daily as needed for cough. 10/06/22  Yes Tommie Sams, DO    Family History History reviewed. No pertinent family history.  Social History Social History   Tobacco Use   Smoking status: Every Day    Types: Cigarettes    Last attempt to quit: 10/28/2004    Years since quitting: 17.9   Smokeless tobacco: Never  Vaping Use   Vaping Use: Never used  Substance Use Topics   Alcohol use: No   Drug use: Never     Allergies   Patient has no known allergies. Per HPI  Review of Systems Review of Systems   Physical Exam Triage Vital Signs ED Triage Vitals  Enc Vitals Group     BP 10/06/22 1003 122/85     Pulse Rate 10/06/22 1003 (!) 103     Resp 10/06/22 1003 16     Temp 10/06/22 1003 98.2 F (36.8 C)      Temp Source 10/06/22 1003 Oral     SpO2 10/06/22 1003 95 %     Weight 10/06/22 1001 247 lb (112 kg)     Height 10/06/22 1001 5\' 6"  (1.676 m)     Head Circumference --      Peak Flow --      Pain Score 10/06/22 1001 6     Pain Loc --      Pain Edu? --      Excl. in GC? --    No data found.  Updated Vital Signs BP 122/85 (BP Location: Right Arm)   Pulse (!) 103   Temp 98.2 F (36.8 C) (Oral)   Resp 16   Ht 5\' 6"  (1.676 m)   Wt 112 kg   SpO2 95%   BMI 39.87 kg/m   Visual Acuity Right Eye Distance:   Left Eye Distance:   Bilateral Distance:    Right Eye Near:   Left Eye Near:    Bilateral Near:     Physical Exam Vitals and nursing note reviewed.  Constitutional:      General: She is not in acute distress.    Appearance: She is obese.  HENT:     Head: Normocephalic and atraumatic.     Left Ear: Tympanic membrane normal.  Ears:     Comments: Right TM with severe erythema.    Mouth/Throat:     Pharynx: Oropharynx is clear.  Eyes:     General:        Right eye: No discharge.        Left eye: No discharge.     Conjunctiva/sclera: Conjunctivae normal.  Cardiovascular:     Rate and Rhythm: Normal rate and regular rhythm.  Pulmonary:     Effort: Pulmonary effort is normal.     Breath sounds: Wheezing present.  Neurological:     Mental Status: She is alert.    UC Treatments / Results  Labs (all labs ordered are listed, but only abnormal results are displayed) Labs Reviewed - No data to display  EKG   Radiology No results found.  Procedures Procedures (including critical care time)  Medications Ordered in UC Medications - No data to display  Initial Impression / Assessment and Plan / UC Course  I have reviewed the triage vital signs and the nursing notes.  Pertinent labs & imaging results that were available during my care of the patient were reviewed by me and considered in my medical decision making (see chart for details).    41 year old  female presents with respiratory symptoms.  Otitis media noted on exam.  Treating with Augmentin.  Patient with significant cough and wheezing.  Current smoker.  Albuterol and prednisone as prescribed.  Promethazine DM for cough.  Work note given.  Final Clinical Impressions(s) / UC Diagnoses   Final diagnoses:  Right otitis media, unspecified otitis media type  Acute bronchitis, unspecified organism   Discharge Instructions   None    ED Prescriptions     Medication Sig Dispense Auth. Provider   amoxicillin-clavulanate (AUGMENTIN) 875-125 MG tablet Take 1 tablet by mouth every 12 (twelve) hours. 14 tablet Irene Mitcham G, DO   predniSONE (DELTASONE) 50 MG tablet 1 tablet daily x 5 days 5 tablet Lynlee Stratton G, DO   albuterol (VENTOLIN HFA) 108 (90 Base) MCG/ACT inhaler Inhale 1-2 puffs into the lungs every 6 (six) hours as needed for wheezing or shortness of breath. 18 g Junita Kubota G, DO   promethazine-dextromethorphan (PROMETHAZINE-DM) 6.25-15 MG/5ML syrup Take 5 mLs by mouth 4 (four) times daily as needed for cough. 118 mL Tommie Sams, DO      PDMP not reviewed this encounter.   Tommie Sams, [place] 10/06/22 1038

## 2023-01-14 ENCOUNTER — Inpatient Hospital Stay
Admission: RE | Admit: 2023-01-14 | Discharge: 2023-01-14 | Disposition: A | Discharge status: Home / Self Care | Payer: Self-pay | Source: Ambulatory Visit

## 2023-01-14 ENCOUNTER — Ambulatory Visit
Admission: EM | Admit: 2023-01-14 | Discharge: 2023-01-14 | Disposition: A | Discharge status: Home / Self Care | Payer: BC Managed Care – PPO | Attending: Emergency Medicine | Admitting: Emergency Medicine

## 2023-01-14 DIAGNOSIS — Z1152 Encounter for screening for COVID-19: Secondary | ICD-10-CM | POA: Insufficient documentation

## 2023-01-14 DIAGNOSIS — J069 Acute upper respiratory infection, unspecified: Secondary | ICD-10-CM | POA: Diagnosis not present

## 2023-01-14 DIAGNOSIS — F17210 Nicotine dependence, cigarettes, uncomplicated: Secondary | ICD-10-CM | POA: Diagnosis not present

## 2023-01-14 DIAGNOSIS — R1110 Vomiting, unspecified: Secondary | ICD-10-CM | POA: Insufficient documentation

## 2023-01-14 DIAGNOSIS — R058 Other specified cough: Secondary | ICD-10-CM | POA: Diagnosis not present

## 2023-01-14 LAB — SARS CORONAVIRUS 2 BY RT PCR: SARS Coronavirus 2 by RT PCR: NEGATIVE

## 2023-01-14 LAB — RAPID INFLUENZA A&B ANTIGENS
Influenza A (ARMC): NEGATIVE
Influenza B (ARMC): NEGATIVE

## 2023-01-14 LAB — GROUP A STREP BY PCR: Group A Strep by PCR: NOT DETECTED

## 2023-01-14 MED ORDER — BENZONATATE 100 MG PO CAPS: 200.0000 mg | ORAL_CAPSULE | Freq: Three times a day (TID) | ORAL | 0 refills | Status: DC

## 2023-01-14 MED ORDER — IPRATROPIUM BROMIDE 0.06 % NA SOLN: 2.0000 | Freq: Four times a day (QID) | NASAL | 12 refills | Status: DC

## 2023-01-14 MED ORDER — PROMETHAZINE-DM 6.25-15 MG/5ML PO SYRP: 5.0000 mL | ORAL_SOLUTION | Freq: Four times a day (QID) | ORAL | 0 refills | Status: DC | PRN

## 2023-01-14 NOTE — Discharge Instructions (Signed)
Your testing today was negative for COVID, influenza, and strep.  I do believe you have a viral respiratory infection.  Please use over-the-counter Tylenol and/or ibuprofen according to package instructions as needed for pain and bodyaches.  Use the Atrovent nasal spray, 2 squirts in each nostril every 6 hours, as needed for runny nose and postnasal drip.  Use the Tessalon Perles every 8 hours during the day.  Take them with a small sip of water.  They may give you some numbness to the base of your tongue or a metallic taste in your mouth, this is normal.  Use the Promethazine DM cough syrup at bedtime for cough and congestion.  It will make you drowsy so do not take it during the day.  Return for reevaluation or see your primary care provider for any new or worsening symptoms.

## 2023-01-14 NOTE — ED Triage Notes (Signed)
Pt c/o sore throat,bodyaches,chills & emesis x1 day. No episodes today, denies any fevers.

## 2023-01-14 NOTE — ED Provider Notes (Signed)
[facility]    CSN: [id number] Arrival date & time: 01/14/23  1500      History   Chief Complaint Chief Complaint  Patient presents with   Sore Throat    Appt   Generalized Body Aches   Chills   Emesis    HPI Karen Harrington is a 42 y.o. female.   HPI  42 year old female with no significant past medical history presents for evaluation of runny nose, nasal congestion, sore throat, body aches, chills, productive cough of white sputum, and a single bout of vomiting that started yesterday.  She denies ear pain, shortness of breath, wheezing, or diarrhea.  History reviewed. No pertinent past medical history.  There are no problems to display for this patient.   History reviewed. No pertinent surgical history.  OB History   No obstetric history on file.      Home Medications    Prior to Admission medications   Medication Sig Start Date End Date Taking? Authorizing Provider  benzonatate (TESSALON) 100 MG capsule Take 2 capsules (200 mg total) by mouth every 8 (eight) hours. 01/14/23  Yes Margarette Canada, NP  ipratropium (ATROVENT) 0.06 % nasal spray Place 2 sprays into both nostrils 4 (four) times daily. 01/14/23  Yes Margarette Canada, NP  promethazine-dextromethorphan (PROMETHAZINE-DM) 6.25-15 MG/5ML syrup Take 5 mLs by mouth 4 (four) times daily as needed. 01/14/23  Yes Margarette Canada, NP  albuterol (VENTOLIN HFA) 108 (90 Base) MCG/ACT inhaler Inhale 1-2 puffs into the lungs every 6 (six) hours as needed for wheezing or shortness of breath. 10/06/22   Coral Spikes, DO    Family History History reviewed. No pertinent family history.  Social History Social History   Tobacco Use   Smoking status: Every Day    Types: Cigarettes    Last attempt to quit: 10/28/2004    Years since quitting: 18.2   Smokeless tobacco: Never  Vaping Use   Vaping Use: Never used  Substance Use Topics   Alcohol use: No   Drug use: Never     Allergies   Patient has no known  allergies.   Review of Systems Review of Systems  Constitutional:  Positive for fever.  HENT:  Positive for congestion, rhinorrhea and sore throat. Negative for ear pain.   Respiratory:  Positive for cough. Negative for shortness of breath and wheezing.   Gastrointestinal:  Positive for nausea and vomiting. Negative for diarrhea.  Musculoskeletal:  Positive for arthralgias and myalgias.  Neurological:  Positive for headaches.     Physical Exam Triage Vital Signs ED Triage Vitals  Enc Vitals Group     BP      Pulse      Resp      Temp      Temp src      SpO2      Weight      Height      Head Circumference      Peak Flow      Pain Score      Pain Loc      Pain Edu?      Excl. in [place]?    No data found.  Updated Vital Signs BP 129/82 (BP Location: Left Arm)   Pulse 95   Temp 98.3 F (36.8 C) (Oral)   Resp 16   Ht 5\' 7"  (1.702 m)   Wt 250 lb (113.4 kg)   SpO2 95%   BMI 39.16 kg/m   Visual Acuity Right Eye  Distance:   Left Eye Distance:   Bilateral Distance:    Right Eye Near:   Left Eye Near:    Bilateral Near:     Physical Exam Vitals and nursing note reviewed.  Constitutional:      Appearance: Normal appearance. She is not ill-appearing.  HENT:     Head: Normocephalic and atraumatic.     Right Ear: Tympanic membrane, ear canal and external ear normal. There is no impacted cerumen.     Left Ear: Tympanic membrane, ear canal and external ear normal. There is no impacted cerumen.     Nose: Congestion and rhinorrhea present.     Comments: Nasal mucosa is erythematous edematous with clear discharge in both nares.    Mouth/Throat:     Mouth: Mucous membranes are moist.     Pharynx: Oropharynx is clear. Posterior oropharyngeal erythema present. No oropharyngeal exudate.     Comments: Tonsillar pillars are erythematous but free of injection or exudate.  Posterior oropharynx does demonstrate erythema with Cardiovascular:     Rate and Rhythm: Normal rate and  regular rhythm.     Pulses: Normal pulses.     Heart sounds: Normal heart sounds. No murmur heard.    No friction rub. No gallop.  Pulmonary:     Effort: Pulmonary effort is normal.     Breath sounds: Normal breath sounds. No wheezing, rhonchi or rales.  Musculoskeletal:     Cervical back: Normal range of motion and neck supple.  Lymphadenopathy:     Cervical: No cervical adenopathy.  Skin:    General: Skin is warm and dry.     Capillary Refill: Capillary refill takes less than 2 seconds.     Findings: No rash.  Neurological:     General: No focal deficit present.     Mental Status: She is alert and oriented to person, place, and time.      UC Treatments / Results  Labs (all labs ordered are listed, but only abnormal results are displayed) Labs Reviewed  GROUP A STREP BY PCR  SARS CORONAVIRUS 2 BY RT PCR  RAPID INFLUENZA A&B ANTIGENS    EKG   Radiology No results found.  Procedures Procedures (including critical care time)  Medications Ordered in UC Medications - No data to display  Initial Impression / Assessment and Plan / UC Course  I have reviewed the triage vital signs and the nursing notes.  Pertinent labs & imaging results that were available during my care of the patient were reviewed by me and considered in my medical decision making (see chart for details).   Patient is a pleasant, nontoxic-appearing 42 year old female presenting for evaluation of flu/COVID-like symptoms that began yesterday as outlined in HPI above.  Her physical exam does reveal inflamed nasal mucosa with clear rhinorrhea as well as edematous and erythematous tonsillar pillars but they are free of exudate.  Cardiopulmonary exam is benign.  She states that one of her coworkers did test positive for COVID approximately 2 weeks ago and she has been on other people with respiratory symptoms.  Influenza antigen test, COVID PCR, and strep PCR are collected at triage.  Influenza antigen test is  negative for A and B.  Strep PCR is negative.  COVID PCR is negative.  I will discharge patient home with a diagnosis of viral URI with a cough and treat her symptoms with Atrovent nasal spray to help with the congestion and Tessalon Perles and Promethazine DM cough Ceptava with cough and congestion.  Over-the-counter Tylenol and or ibuprofen as needed for pain.  Work note provided.  Return precautions reviewed.   Final Clinical Impressions(s) / UC Diagnoses   Final diagnoses:  Viral URI with cough     Discharge Instructions      Your testing today was negative for COVID, influenza, and strep.  I do believe you have a viral respiratory infection.  Please use over-the-counter Tylenol and/or ibuprofen according to package instructions as needed for pain and bodyaches.  Use the Atrovent nasal spray, 2 squirts in each nostril every 6 hours, as needed for runny nose and postnasal drip.  Use the Tessalon Perles every 8 hours during the day.  Take them with a small sip of water.  They may give you some numbness to the base of your tongue or a metallic taste in your mouth, this is normal.  Use the Promethazine DM cough syrup at bedtime for cough and congestion.  It will make you drowsy so do not take it during the day.  Return for reevaluation or see your primary care provider for any new or worsening symptoms.      ED Prescriptions     Medication Sig Dispense Auth. Provider   benzonatate (TESSALON) 100 MG capsule Take 2 capsules (200 mg total) by mouth every 8 (eight) hours. 21 capsule Margarette Canada, NP   ipratropium (ATROVENT) 0.06 % nasal spray Place 2 sprays into both nostrils 4 (four) times daily. 15 mL Margarette Canada, NP   promethazine-dextromethorphan (PROMETHAZINE-DM) 6.25-15 MG/5ML syrup Take 5 mLs by mouth 4 (four) times daily as needed. 118 mL Margarette Canada, NP      PDMP not reviewed this encounter.   Margarette Canada, NP 01/14/23 [phone]

## 2023-02-05 ENCOUNTER — Ambulatory Visit
Admission: EM | Admit: 2023-02-05 | Discharge: 2023-02-05 | Disposition: A | Discharge status: Home / Self Care | Payer: BC Managed Care – PPO | Attending: Family Medicine | Admitting: Family Medicine

## 2023-02-05 ENCOUNTER — Encounter: Payer: Self-pay | Admitting: Emergency Medicine

## 2023-02-05 DIAGNOSIS — H1030 Unspecified acute conjunctivitis, unspecified eye: Secondary | ICD-10-CM

## 2023-02-05 MED ORDER — POLYMYXIN B-TRIMETHOPRIM 10000-0.1 UNIT/ML-% OP SOLN: 2.0000 [drp] | Freq: Four times a day (QID) | OPHTHALMIC | 0 refills | Status: DC

## 2023-02-05 NOTE — ED Provider Notes (Signed)
[facility]    CSN: [id number] Arrival date & time: 02/05/23  1834      History   Chief Complaint Chief Complaint  Patient presents with   Eye Problem    Red and irritated - Entered by patient    HPI HPI  Karen Harrington is a 42 y.o. female.    Karen Harrington presents for bilateal eye redness that started this morning.  Reports persistent yellow-green eye drainage.  Eyes are somewhat itchy.  She does have seasonal allergies. Karen Harrington does not feel like something is in her eye.  Took her allergy medicine but this did not help. Karen Harrington has not had any trouble seeing.  Karen Harrington has otherwise been well and has no additional concerns today.   History reviewed. No pertinent past medical history.  There are no problems to display for this patient.   History reviewed. No pertinent surgical history.  OB History   No obstetric history on file.      Home Medications    Prior to Admission medications   Medication Sig Start Date End Date Taking? Authorizing Provider  trimethoprim-polymyxin b (POLYTRIM) ophthalmic solution Place 2 drops into both eyes every 6 (six) hours. For 7 days 02/05/23  Yes Helena Sardo, DO  albuterol (VENTOLIN HFA) 108 (90 Base) MCG/ACT inhaler Inhale 1-2 puffs into the lungs every 6 (six) hours as needed for wheezing or shortness of breath. 10/06/22   Tommie Samsook, Jayce G, DO  benzonatate (TESSALON) 100 MG capsule Take 2 capsules (200 mg total) by mouth every 8 (eight) hours. 01/14/23   Becky Augustayan, Jeremy, NP  ipratropium (ATROVENT) 0.06 % nasal spray Place 2 sprays into both nostrils 4 (four) times daily. 01/14/23   Becky Augustayan, Jeremy, NP  promethazine-dextromethorphan (PROMETHAZINE-DM) 6.25-15 MG/5ML syrup Take 5 mLs by mouth 4 (four) times daily as needed. 01/14/23   Becky Augustayan, Jeremy, NP    Family History No family history on file.  Social History Social History   Tobacco Use   Smoking status: Every Day    Types: Cigarettes    Last attempt to quit: 10/28/2004    Years since  quitting: 18.3   Smokeless tobacco: Never  Vaping Use   Vaping Use: Never used  Substance Use Topics   Alcohol use: No   Drug use: Never     Allergies   Patient has no known allergies.   Review of Systems Review of Systems : negative unless otherwise stated in HPI.      Physical Exam Triage Vital Signs ED Triage Vitals  Enc Vitals Group     BP 02/05/23 1851 (!) 137/100     Pulse Rate 02/05/23 1851 93     Resp 02/05/23 1851 18     Temp 02/05/23 1851 98.5 F (36.9 C)     Temp Source 02/05/23 1851 Oral     SpO2 02/05/23 1851 95 %     Weight --      Height --      Head Circumference --      Peak Flow --      Pain Score 02/05/23 1850 4     Pain Loc --      Pain Edu? --      Excl. in [place]? --    No data found.  Updated Vital Signs BP (!) 137/100 (BP Location: Right Arm)   Pulse 93   Temp 98.5 F (36.9 C) (Oral)   Resp 18   SpO2 95%   Visual Acuity Right Eye Distance: 20/20 (  corrected) Left Eye Distance: 20/20 (corrected) Bilateral Distance: 20/20 (corrected)  Right Eye Near:   Left Eye Near:    Bilateral Near:     Physical Exam  GEN: pleasant well appearing female, in no acute distress  NECK: normal ROM  CV: regular rate  RESP: no increased work of breathing EYES:     General: Lids are normal. No foreign bodies appreciated. Vision grossly intact. Gaze aligned appropriately.       Extraocular Movements: Extraocular movements intact and performed without pain,    Conjunctiva/sclera:  bilateral scleral injection with yellow crusting visible.  PERRLA  No chemosis or hemorrhage. SKIN: warm and dry   UC Treatments / Results  Labs (all labs ordered are listed, but only abnormal results are displayed) Labs Reviewed - No data to display  EKG   Radiology No results found.  Procedures Procedures (including critical care time)  Medications Ordered in UC Medications - No data to display  Initial Impression / Assessment and Plan / UC Course  I have  reviewed the triage vital signs and the nursing notes.  Pertinent labs & imaging results that were available during my care of the patient were reviewed by me and considered in my medical decision making (see chart for details).     Patient is a 42 y.o. female who presents for bilateral eye redness, itching and discharge.  On exam, she has a evidence of bilateral conjunctivitis. I suspect a bacterial cause given the bilateral mucoid discharge and lack of resolution with allergy medications. Will give delayed treatment with Polytrim eye drops to be used if symptoms do not improve in the next 72 hours.  Understanding voiced.   Discussed MDM, treatment plan and plan for follow-up with patient who agrees with plan.  Final Clinical Impressions(s) / UC Diagnoses   Final diagnoses:  Acute bacterial conjunctivitis, unspecified laterality     Discharge Instructions      Stop by the pharmacy to pick up your prescriptions.  Follow up with your eye care provider as needed. Be sure to wash your hands regularly.     ED Prescriptions     Medication Sig Dispense Auth. Provider   trimethoprim-polymyxin b (POLYTRIM) ophthalmic solution Place 2 drops into both eyes every 6 (six) hours. For 7 days 10 mL Katha Cabal, DO      PDMP not reviewed this encounter.   Katha Cabal, DO 02/11/23 1517

## 2023-02-05 NOTE — Discharge Instructions (Signed)
Stop by the pharmacy to pick up your prescriptions.  Follow up with your eye care provider as needed. Be sure to wash your hands regularly.

## 2023-02-05 NOTE — ED Triage Notes (Signed)
Pt c/o bilateral ear drainage and redness that started this morning.

## 2023-04-29 ENCOUNTER — Emergency Department
Admission: EM | Admit: 2023-04-29 | Discharge: 2023-04-29 | Disposition: A | Discharge status: Home / Self Care | Payer: BC Managed Care – PPO | Attending: Emergency Medicine | Admitting: Emergency Medicine

## 2023-04-29 ENCOUNTER — Emergency Department: Payer: BC Managed Care – PPO

## 2023-04-29 ENCOUNTER — Other Ambulatory Visit: Payer: Self-pay

## 2023-04-29 DIAGNOSIS — R1032 Left lower quadrant pain: Secondary | ICD-10-CM | POA: Diagnosis not present

## 2023-04-29 DIAGNOSIS — R109 Unspecified abdominal pain: Secondary | ICD-10-CM

## 2023-04-29 DIAGNOSIS — K625 Hemorrhage of anus and rectum: Secondary | ICD-10-CM | POA: Insufficient documentation

## 2023-04-29 DIAGNOSIS — N200 Calculus of kidney: Secondary | ICD-10-CM | POA: Diagnosis not present

## 2023-04-29 DIAGNOSIS — N83201 Unspecified ovarian cyst, right side: Secondary | ICD-10-CM | POA: Diagnosis not present

## 2023-04-29 LAB — CBC
HCT: 46.1 % — ABNORMAL HIGH (ref 36.0–46.0)
Hemoglobin: 14.3 g/dL (ref 12.0–15.0)
MCH: 27.8 pg (ref 26.0–34.0)
MCHC: 31 g/dL (ref 30.0–36.0)
MCV: 89.5 fL (ref 80.0–100.0)
Platelets: 235 10*3/uL (ref 150–400)
RBC: 5.15 MIL/uL — ABNORMAL HIGH (ref 3.87–5.11)
RDW: 13 % (ref 11.5–15.5)
WBC: 9.2 10*3/uL (ref 4.0–10.5)
nRBC: 0 % (ref 0.0–0.2)

## 2023-04-29 LAB — URINALYSIS, ROUTINE W REFLEX MICROSCOPIC
Bilirubin Urine: NEGATIVE
Glucose, UA: NEGATIVE mg/dL
Hgb urine dipstick: NEGATIVE
Ketones, ur: NEGATIVE mg/dL
Leukocytes,Ua: NEGATIVE
Nitrite: NEGATIVE
Protein, ur: 30 mg/dL — AB
Specific Gravity, Urine: 1.021 (ref 1.005–1.030)
pH: 7 (ref 5.0–8.0)

## 2023-04-29 LAB — PREGNANCY, URINE: Preg Test, Ur: NEGATIVE

## 2023-04-29 LAB — LIPASE, BLOOD: Lipase: 27 U/L (ref 11–51)

## 2023-04-29 LAB — COMPREHENSIVE METABOLIC PANEL
ALT: 18 U/L (ref 0–44)
AST: 18 U/L (ref 15–41)
Albumin: 3.7 g/dL (ref 3.5–5.0)
Alkaline Phosphatase: 72 U/L (ref 38–126)
Anion gap: 7 (ref 5–15)
BUN: 9 mg/dL (ref 6–20)
CO2: 32 mmol/L (ref 22–32)
Calcium: 8.8 mg/dL — ABNORMAL LOW (ref 8.9–10.3)
Chloride: 99 mmol/L (ref 98–111)
Creatinine, Ser: 0.49 mg/dL (ref 0.44–1.00)
GFR, Estimated: >60 mL/min (ref >60)
Glucose, Bld: 100 mg/dL — ABNORMAL HIGH (ref 70–99)
Potassium: 3.8 mmol/L (ref 3.5–5.1)
Sodium: 138 mmol/L (ref 135–145)
Total Bilirubin: 0.3 mg/dL (ref 0.3–1.2)
Total Protein: 7.4 g/dL (ref 6.5–8.1)

## 2023-04-29 MED ORDER — POLYETHYLENE GLYCOL 3350 17 G PO PACK: 17.0000 g | PACK | Freq: Every day | ORAL | 0 refills | Status: DC

## 2023-04-29 NOTE — ED Triage Notes (Signed)
Pt here with rectal bleeding since yesterday. Pt states she has been having bloody bowel movements all morning. Pt reports mild abd pain. Pt also has pain on his left flank side which is not new for her. Pt denies vomiting but states she is nauseous.

## 2023-04-29 NOTE — ED Provider Notes (Signed)
[facility] Provider Note    Event Date/Time   First MD Initiated Contact with Patient 04/29/23 [phone]     (approximate)   History   Rectal Bleeding   HPI  Karen Harrington is a 42 y.o. female   Past medical history of no significant past medical history presents the emergency department with rectal bleeding starting yesterday.  Bright red blood mixed in with bowel movement and then became bright red blood only this morning.  She has some subacute left lower quadrant/left flank pain ongoing for many weeks.  Has had a kidney stone requiring lithotripsy in the remote past.  No dysuria.  No vaginal bleeding.  No vaginal discharge.  No fevers or chills.  No blood thinner.   External Medical Documents Reviewed: CT renal scan from June 2022 with punctate bilateral nephrolithiasis but no obstructive uropathy      Physical Exam   Triage Vital Signs: ED Triage Vitals  Enc Vitals Group     BP 04/29/23 0919 135/89     Pulse Rate 04/29/23 0919 79     Resp 04/29/23 0919 18     Temp 04/29/23 0918 97.9 F (36.6 C)     Temp Source 04/29/23 0918 Oral     SpO2 04/29/23 0919 97 %     Weight 04/29/23 0920 250 lb (113.4 kg)     Height 04/29/23 0920 5\' 7"  (1.702 m)     Head Circumference --      Peak Flow --      Pain Score 04/29/23 0920 8     Pain Loc --      Pain Edu? --      Excl. in [place]? --     Most recent vital signs: Vitals:   04/29/23 0918 04/29/23 0919  BP:  135/89  Pulse:  79  Resp:  18  Temp: 97.9 F (36.6 C) 97.9 F (36.6 C)  SpO2:  97%    General: Awake, no distress.  CV:  Good peripheral perfusion.  Resp:  Normal effort.  Abd:  No distention.  Other:  Mild left-sided CVA tenderness to palpation otherwise benign nontender abdominal exam.  Rectal exam shows Blumenberg stool no blood or melena.  Hemodynamics appropriate and reassuring comfortable nontoxic appearing patient.   ED Results / Procedures / Treatments   Labs (all labs ordered are  listed, but only abnormal results are displayed) Labs Reviewed  COMPREHENSIVE METABOLIC PANEL - Abnormal; Notable for the following components:      Result Value   Glucose, Bld 100 (*)    Calcium 8.8 (*)    All other components within normal limits  CBC - Abnormal; Notable for the following components:   RBC 5.15 (*)    HCT 46.1 (*)    All other components within normal limits  URINALYSIS, ROUTINE W REFLEX MICROSCOPIC - Abnormal; Notable for the following components:   Color, Urine YELLOW (*)    APPearance CLOUDY (*)    Protein, ur 30 (*)    Bacteria, UA FEW (*)    All other components within normal limits  LIPASE, BLOOD  PREGNANCY, URINE     I ordered and reviewed the above labs they are notable for white blood cell count and H&H are normal  RADIOLOGY I independently reviewed and interpreted CT scan of the abdomen pelvis and see no obvious hydronephrosis   PROCEDURES:  Critical Care performed: No  Procedures   MEDICATIONS ORDERED IN ED: Medications - No data to  display  IMPRESSION / MDM / ASSESSMENT AND PLAN / ED COURSE  I reviewed the triage vital signs and the nursing notes.                                Patient's presentation is most consistent with acute presentation with potential threat to life or bodily function.  Differential diagnosis includes, but is not limited to, acute blood loss anemia, lower GI bleeding, hemorrhoids, renal colic, pyelonephritis, UTI, diverticulitis   The patient is on the cardiac monitor to evaluate for evidence of arrhythmia and/or significant heart rate changes.  MDM: This is a patient with rectal bleeding new onset with negative rectal exam normal H&H and normal hemodynamics not on blood thinners.  She has some left flank pain subacute that may or may not be related, no urinary symptoms and a urinalysis showing no obvious signs of urinary tract infection with rare bacteria no inflammatory changes defer antibiotics, but with a  history of kidney stones I will check a CT flank to assess for kidney stone, assess for diverticulitis as well on the scan.  Otherwise she is well-appearing nontoxic with normal vital signs normal blood testing, I doubt significant blood loss related to her rectal bleeding.  Even if she has diverticulitis given her healthy status and minimal symptoms, will defer antibiotics and instead offer observation and close PMD follow-up.  Pending the results of her CT scan, if unremarkable plan will be for discharge close PMD follow-up, GI referral as deemed necessary by PMD upon reevaluation.       FINAL CLINICAL IMPRESSION(S) / ED DIAGNOSES   Final diagnoses:  Rectal bleeding  Left flank pain     Rx / DC Orders   ED Discharge Orders          Ordered    Ambulatory Referral to Primary Care (Establish Care)        04/29/23 1141    polyethylene glycol (MIRALAX) 17 g packet  Daily        04/29/23 1141             Note:  This document was prepared using Dragon voice recognition software and may include unintentional dictation errors.    Pilar Jarvis, MD 04/29/23 [phone]

## 2023-04-29 NOTE — ED Notes (Signed)
Pt refused discharge vitals signs at this time

## 2023-04-29 NOTE — Discharge Instructions (Signed)
Take MiraLAX daily as prescribed to help soften stools which may help relieve rectal bleeding if this is due to hemorrhoids.  Take acetaminophen 650 mg and ibuprofen 400 mg every 6 hours for pain.  Take with food.   I made a referral to a primary doctor who call you to establish care.  Your testing in the emergency department showed no emergency conditions like infections, blockages, or significant blood loss to account for your symptoms.  If you experience any new, worsening, active symptoms call your doctor right away or come back to the emergency department for reevaluation.

## 2023-08-11 ENCOUNTER — Ambulatory Visit (INDEPENDENT_AMBULATORY_CARE_PROVIDER_SITE_OTHER): Payer: Self-pay | Admitting: Physician Assistant

## 2023-08-11 DIAGNOSIS — Z91199 Patient's noncompliance with other medical treatment and regimen due to unspecified reason: Secondary | ICD-10-CM

## 2023-08-11 NOTE — Progress Notes (Signed)
Patient was not seen for appt d/t no call, no show, or late arrival >10 mins past appt time.    Karen Lat PA [facility] [address] [place], [place] [address] [phone] (phone) [phone] (fax) [facility] show

## 2024-02-11 ENCOUNTER — Encounter: Payer: Self-pay | Admitting: Family Medicine

## 2024-02-11 ENCOUNTER — Ambulatory Visit: Admitting: Family Medicine

## 2024-02-11 VITALS — BP 131/95 | HR 80 | Ht 67.0 in | Wt 256.4 lb

## 2024-02-11 DIAGNOSIS — R030 Elevated blood-pressure reading, without diagnosis of hypertension: Secondary | ICD-10-CM

## 2024-02-11 DIAGNOSIS — M25511 Pain in right shoulder: Secondary | ICD-10-CM

## 2024-02-11 DIAGNOSIS — Z13220 Encounter for screening for lipoid disorders: Secondary | ICD-10-CM | POA: Diagnosis not present

## 2024-02-11 DIAGNOSIS — M25512 Pain in left shoulder: Secondary | ICD-10-CM

## 2024-02-11 DIAGNOSIS — Z23 Encounter for immunization: Secondary | ICD-10-CM | POA: Diagnosis not present

## 2024-02-11 DIAGNOSIS — R202 Paresthesia of skin: Secondary | ICD-10-CM

## 2024-02-11 DIAGNOSIS — R5382 Chronic fatigue, unspecified: Secondary | ICD-10-CM

## 2024-02-11 DIAGNOSIS — F17200 Nicotine dependence, unspecified, uncomplicated: Secondary | ICD-10-CM

## 2024-02-11 DIAGNOSIS — Z13228 Encounter for screening for other metabolic disorders: Secondary | ICD-10-CM | POA: Diagnosis not present

## 2024-02-11 DIAGNOSIS — Z1159 Encounter for screening for other viral diseases: Secondary | ICD-10-CM

## 2024-02-11 DIAGNOSIS — K529 Noninfective gastroenteritis and colitis, unspecified: Secondary | ICD-10-CM

## 2024-02-11 DIAGNOSIS — Z114 Encounter for screening for human immunodeficiency virus [HIV]: Secondary | ICD-10-CM

## 2024-02-11 DIAGNOSIS — R41840 Attention and concentration deficit: Secondary | ICD-10-CM | POA: Diagnosis not present

## 2024-02-11 DIAGNOSIS — R413 Other amnesia: Secondary | ICD-10-CM

## 2024-02-11 DIAGNOSIS — E559 Vitamin D deficiency, unspecified: Secondary | ICD-10-CM

## 2024-02-11 DIAGNOSIS — G2581 Restless legs syndrome: Secondary | ICD-10-CM

## 2024-02-11 DIAGNOSIS — Z130 Encounter for screening for diseases of the blood and blood-forming organs and certain disorders involving the immune mechanism: Secondary | ICD-10-CM

## 2024-02-11 DIAGNOSIS — Z7689 Persons encountering health services in other specified circumstances: Secondary | ICD-10-CM

## 2024-02-11 DIAGNOSIS — G43709 Chronic migraine without aura, not intractable, without status migrainosus: Secondary | ICD-10-CM

## 2024-02-11 DIAGNOSIS — Z1329 Encounter for screening for other suspected endocrine disorder: Secondary | ICD-10-CM | POA: Diagnosis not present

## 2024-02-11 DIAGNOSIS — M545 Low back pain, unspecified: Secondary | ICD-10-CM

## 2024-02-11 MED ORDER — GABAPENTIN 300 MG PO CAPS: 300.0000 mg | ORAL_CAPSULE | Freq: Every evening | ORAL | 0 refills | Status: DC | PRN

## 2024-02-11 MED ORDER — NAPROXEN 500 MG PO TABS: 500.0000 mg | ORAL_TABLET | Freq: Two times a day (BID) | ORAL | 0 refills | Status: DC

## 2024-02-11 MED ORDER — BUTALBITAL-APAP-CAFFEINE 50-325-40 MG PO TABS: 1.0000 | ORAL_TABLET | Freq: Four times a day (QID) | ORAL | 0 refills | Status: DC | PRN

## 2024-02-11 NOTE — Progress Notes (Signed)
 New patient visit   Patient: Karen Harrington   DOB: January 21, 1981   43 y.o. Female  MRN: [id number] Visit Date: 02/11/2024  Today's healthcare provider: Carlean Charter, DO   Chief Complaint  Patient presents with   New Patient (Initial Visit)    Patient is present to establish care   Subjective    Karen Harrington is a 43 y.o. female who presents today as a new patient to establish care.  HPI HPI     New Patient (Initial Visit)    Additional comments: Patient is present to establish care      Last edited by Pasty Bongo, CMA on 02/11/2024  9:20 AM.       Karen Harrington is a 43 year old female who presents with digestive issues and musculoskeletal pain.  She experiences frequent diarrhea, occurring approximately 10 to 15 minutes after eating, regardless of the type of food consumed. This has been a long-standing issue, requiring multiple bathroom visits daily, including at night. She recalls an episode of severe stomach pain and bloody stools about six months ago, which led to an ER visit. She has not had a solid stool in a long time, with occasional soft stools being the closest to normal.  She frequently experiences migraine headaches, occurring between two to four times a week, lasting two to three days, and sometimes accompanied by light sensitivity. She currently uses ibuprofen  for relief, which provides minimal help.  She reports significant memory issues and difficulty focusing, both at work and at home. She often forgets simple words and needs frequent reminders for appointments.  She experiences severe pain in her left foot and both shoulders, persisting for over a month. New shoes have not alleviated the foot pain. She also has chronic lower back pain, attributed to degenerative disc disease diagnosed in the past. She does not take any regular medications for these pains but occasionally uses ibuprofen .  She has a history of allergies to strawberries and eggs, though she  consumes eggs regularly without apparent issues. She also reports a past diagnosis of rosacea and acne.  She has a history of smoking, having quit cigarettes in January after switching to vaping. She started smoking at age 68, quit for 14 years, and resumed in 2022 before quitting again this year. She uses a vape with 5% nicotine daily.  She experiences restless legs at night, disrupting her sleep. She often gets up to walk around or have a snack to alleviate the discomfort. She reports feeling tired frequently and has difficulty sleeping well.   Due for pap smear (last >10 years) LMP - IUD - Mirena - needs removed and replaced    Past Medical History:  Diagnosis Date   Allergy    Anxiety    Depression    History reviewed. No pertinent surgical history. Family Status  Relation Name Status   Mother  (Not Specified)  No partnership data on file   Family History  Problem Relation Age of Onset   COPD Mother    Heart failure Mother    Social History   Socioeconomic History   Marital status: Married    Spouse name: Not on file   Number of children: Not on file   Years of education: Not on file   Highest education level: Not on file  Occupational History   Not on file  Tobacco Use   Smoking status: Former    Current packs/day: 0.00    Average packs/day: 1 pack/day  for 5.0 years (5.0 ttl pk-yrs)    Types: Cigarettes    Start date: 10/28/2004    Quit date: 10/29/2023    Years since quitting: 0.3   Smokeless tobacco: Never  Vaping Use   Vaping status: Every Day  Substance and Sexual Activity   Alcohol use: Yes    Comment: 1 drink a year   Drug use: Never   Sexual activity: Yes  Other Topics Concern   Not on file  Social History Narrative   Not on file   Social Drivers of Health   Financial Resource Strain: Not on file  Food Insecurity: Not on file  Transportation Needs: Not on file  Physical Activity: Not on file  Stress: Not on file  Social Connections: Not on  file   Outpatient Medications Prior to Visit  Medication Sig   [DISCONTINUED] albuterol  (VENTOLIN  HFA) 108 (90 Base) MCG/ACT inhaler Inhale 1-2 puffs into the lungs every 6 (six) hours as needed for wheezing or shortness of breath. (Patient not taking: Reported on 02/11/2024)   [DISCONTINUED] benzonatate  (TESSALON ) 100 MG capsule Take 2 capsules (200 mg total) by mouth every 8 (eight) hours. (Patient not taking: Reported on 02/11/2024)   [DISCONTINUED] ipratropium (ATROVENT ) 0.06 % nasal spray Place 2 sprays into both nostrils 4 (four) times daily. (Patient not taking: Reported on 02/11/2024)   [DISCONTINUED] polyethylene glycol (MIRALAX ) 17 g packet Take 17 g by mouth daily. (Patient not taking: Reported on 02/11/2024)   [DISCONTINUED] promethazine -dextromethorphan (PROMETHAZINE -DM) 6.25-15 MG/5ML syrup Take 5 mLs by mouth 4 (four) times daily as needed. (Patient not taking: Reported on 02/11/2024)   [DISCONTINUED] trimethoprim -polymyxin b  (POLYTRIM ) ophthalmic solution Place 2 drops into both eyes every 6 (six) hours. For 7 days (Patient not taking: Reported on 02/11/2024)   No facility-administered medications prior to visit.   Allergies  Allergen Reactions   Other Other (See Comments)    Eggs    Immunization History  Administered Date(s) Administered   PNEUMOCOCCAL CONJUGATE-20 02/11/2024   Tdap 11/13/2012, 02/11/2024    Health Maintenance  Topic Date Due   Cervical Cancer Screening (HPV/Pap Cotest)  Never done   COVID-19 Vaccine (1 - 2024-25 season) 07/28/2024 (Originally 06/29/2023)   INFLUENZA VACCINE  05/28/2024   DTaP/Tdap/Td (3 - Td or Tdap) 02/10/2034   Hepatitis C Screening  Completed   HIV Screening  Completed   Pneumococcal Vaccine 52-62 Years old  Aged Out   HPV VACCINES  Aged Out   Meningococcal B Vaccine  Aged Out    Patient Care Team: Huda Petrey, Asencion Blacksmith, DO as PCP - General (Family Medicine)  Review of Systems  Constitutional:  Positive for fatigue. Negative for  appetite change, chills and fever.  Eyes:  Positive for photophobia (mild).  Respiratory:  Negative for chest tightness and shortness of breath.   Cardiovascular:  Negative for chest pain and palpitations.  Gastrointestinal:  Positive for diarrhea (10-15 minutes after eating, regardless of what or when she ate). Negative for abdominal pain, nausea and vomiting.  Musculoskeletal:  Positive for arthralgias (shoulders and foot).  Neurological:  Positive for headaches. Negative for dizziness and weakness.  Psychiatric/Behavioral:  Positive for sleep disturbance.         Objective    BP (!) 131/95 (BP Location: Right Arm, Patient Position: Sitting, Cuff Size: Large)   Pulse 80   Ht 5\' 7"  (1.702 m)   Wt 256 lb 6.4 oz (116.3 kg)   SpO2 98%   BMI 40.16 kg/m  Physical Exam Vitals and nursing note reviewed.  Constitutional:      General: She is awake.     Appearance: Normal appearance.  HENT:     Head: Normocephalic and atraumatic.     Right Ear: Tympanic membrane, ear canal and external ear normal.     Left Ear: Tympanic membrane, ear canal and external ear normal.     Nose: Nose normal.     Mouth/Throat:     Mouth: Mucous membranes are moist.     Pharynx: Oropharynx is clear. No oropharyngeal exudate or posterior oropharyngeal erythema.  Eyes:     General: No scleral icterus.    Extraocular Movements: Extraocular movements intact.     Conjunctiva/sclera: Conjunctivae normal.     Pupils: Pupils are equal, round, and reactive to light.  Neck:     Thyroid: No thyromegaly or thyroid tenderness.  Cardiovascular:     Rate and Rhythm: Normal rate and regular rhythm.     Pulses: Normal pulses.     Heart sounds: Normal heart sounds.  Pulmonary:     Effort: Pulmonary effort is normal. No tachypnea, bradypnea or respiratory distress.     Breath sounds: Normal breath sounds. No stridor. No wheezing, rhonchi or rales.  Abdominal:     General: Bowel sounds are normal. There is no  distension.     Palpations: Abdomen is soft. There is no mass.     Tenderness: There is no abdominal tenderness. There is no guarding.     Hernia: No hernia is present.  Musculoskeletal:     Cervical back: Normal range of motion and neck supple.     Right lower leg: No edema.     Left lower leg: No edema.     Comments: Severe pain lateral left foot and bilateral shoulders (at origin of the acromion fibers of the deltoid, inferolateral to end of AC joint)  Lymphadenopathy:     Cervical: No cervical adenopathy.  Skin:    General: Skin is warm and dry.  Neurological:     Mental Status: She is alert and oriented to person, place, and time. Mental status is at baseline.  Psychiatric:        Mood and Affect: Mood normal.        Behavior: Behavior normal.     Depression Screen    02/11/2024    9:36 AM  PHQ 2/9 Scores  PHQ - 2 Score 3  PHQ- 9 Score 9   Results for orders placed or performed in visit on 02/11/24  Hepatitis C antibody  Result Value Ref Range   Hep C Virus Ab Non Reactive Non Reactive  HIV Antibody (routine testing w rflx)  Result Value Ref Range   HIV Screen 4th Generation wRfx Non Reactive Non Reactive  Comprehensive metabolic panel with GFR  Result Value Ref Range   Glucose 91 70 - 99 mg/dL   BUN 12 6 - 24 mg/dL   Creatinine, Ser 2.95 (L) 0.57 - 1.00 mg/dL   eGFR 621 >30 QM/VHQ/4.69   BUN/Creatinine Ratio 23 9 - 23   Sodium 140 134 - 144 mmol/L   Potassium 4.0 3.5 - 5.2 mmol/L   Chloride 97 96 - 106 mmol/L   CO2 31 (H) 20 - 29 mmol/L   Calcium 9.3 8.7 - 10.2 mg/dL   Total Protein 7.5 6.0 - 8.5 g/dL   Albumin 4.5 3.9 - 4.9 g/dL   Globulin, Total 3.0 1.5 - 4.5 g/dL   Bilirubin Total 0.3 0.0 -  1.2 mg/dL   Alkaline Phosphatase 93 44 - 121 IU/L   AST 19 0 - 40 IU/L   ALT 20 0 - 32 IU/L  Hemoglobin A1c  Result Value Ref Range   Hgb A1c MFr Bld 5.7 (H) 4.8 - 5.6 %   Est. average glucose Bld gHb Est-mCnc 117 mg/dL  Lipid panel  Result Value Ref Range    Cholesterol, Total 177 100 - 199 mg/dL   Triglycerides 409 0 - 149 mg/dL   HDL 61 >81 mg/dL   VLDL Cholesterol Cal 19 5 - 40 mg/dL   LDL Chol Calc (NIH) 97 0 - 99 mg/dL   Chol/HDL Ratio 2.9 0.0 - 4.4 ratio  VITAMIN D  25 Hydroxy (Vit-D Deficiency, Fractures)  Result Value Ref Range   Vit D, 25-Hydroxy 16.6 (L) 30.0 - 100.0 ng/mL  TSH Rfx on Abnormal to Free T4  Result Value Ref Range   TSH 1.280 0.450 - 4.500 uIU/mL  CBC  Result Value Ref Range   WBC 9.2 3.4 - 10.8 x10E3/uL   RBC 4.81 3.77 - 5.28 x10E6/uL   Hemoglobin 13.6 11.1 - 15.9 g/dL   Hematocrit 19.1 47.8 - 46.6 %   MCV 88 79 - 97 fL   MCH 28.3 26.6 - 33.0 pg   MCHC 32.3 31.5 - 35.7 g/dL   RDW 29.5 62.1 - 30.8 %   Platelets 282 150 - 450 x10E3/uL  Iron, TIBC and Ferritin Panel  Result Value Ref Range   Total Iron Binding Capacity 313 250 - 450 ug/dL   UIBC 657 846 - 962 ug/dL   Iron 65 27 - 952 ug/dL   Iron Saturation 21 15 - 55 %   Ferritin 32 15 - 150 ng/mL  Magnesium  Result Value Ref Range   Magnesium 1.8 1.6 - 2.3 mg/dL  W41 and Folate Panel  Result Value Ref Range   Vitamin B-12 163 (L) 232 - 1,245 pg/mL   Folate 8.6 >3.0 ng/mL    Assessment & Plan     Attention deficit  Establishing care with new doctor, encounter for  Nicotine dependence with current use  Chronic migraine without aura without status migrainosus, not intractable -     Butalbital -APAP-Caffeine ; Take 1 tablet by mouth every 6 (six) hours as needed for headache.  Dispense: 14 tablet; Refill: 0  Memory changes -     TSH Rfx on Abnormal to Free T4  Restless legs -     VITAMIN D  25 Hydroxy (Vit-D Deficiency, Fractures) -     CBC -     Iron, TIBC and Ferritin Panel -     Magnesium -     B12 and Folate Panel -     Gabapentin ; Take 1 capsule (300 mg total) by mouth at bedtime as needed and may repeat dose one time if needed.  Dispense: 60 capsule; Refill: 0  Paresthesia -     VITAMIN D  25 Hydroxy (Vit-D Deficiency, Fractures) -      Iron, TIBC and Ferritin Panel -     Gabapentin ; Take 1 capsule (300 mg total) by mouth at bedtime as needed and may repeat dose one time if needed.  Dispense: 60 capsule; Refill: 0  Chronic fatigue -     TSH Rfx on Abnormal to Free T4 -     CBC -     Iron, TIBC and Ferritin Panel -     Magnesium -     B12 and Folate Panel  Elevated blood-pressure reading  without diagnosis of hypertension  Chronic diarrhea -     Ambulatory referral to Gastroenterology  Acute pain of both shoulders -     Naproxen ; Take 1 tablet (500 mg total) by mouth 2 (two) times daily with a meal.  Dispense: 10 tablet; Refill: 0  Chronic bilateral low back pain without sciatica -     Naproxen ; Take 1 tablet (500 mg total) by mouth 2 (two) times daily with a meal.  Dispense: 10 tablet; Refill: 0  Need for diphtheria-tetanus-pertussis (Tdap) vaccine -     Tdap vaccine greater than or equal to 7yo IM  Encounter for screening for HIV -     HIV Antibody (routine testing w rflx)  Need for pneumococcal vaccine -     Pneumococcal conjugate vaccine 20-valent  Need for hepatitis C screening test -     Hepatitis C antibody  Immunization due -     Pneumococcal conjugate vaccine 20-valent -     Tdap vaccine greater than or equal to 7yo IM  Screening for lipid disorders -     Lipid panel  Screening for deficiency anemia -     CBC  Screening for endocrine, metabolic and immunity disorder -     Comprehensive metabolic panel with GFR -     Hemoglobin A1c     Establishing care with a new doctor, encounter for; nicotine dependence Due for Hepatitis C and HIV screening, Pap smear, and Mirena IUD replacement. Smoking history with recent vaping. Slightly elevated blood pressure. - Perform Hepatitis C and HIV screening. - Refer to OB-GYN for Pap smear and Mirena IUD replacement. - Advise on reducing nicotine strength in vaping products, with goal of eventual cessation. - Encourage smoking cessation and healthy  lifestyle changes.  Chronic Diarrhea Chronic diarrhea with postprandial bowel movements, severe abdominal pain, and prior hematochezia (currently resolved).  - Refer to gastroenterology for further evaluation.  Migraine Headaches Frequent migraines with photophobia, minimal relief from ibuprofen . Discussed Fioricet and sumatriptan. - Prescribe Fioricet, 14 tablets, for migraine onset. - Consider sumatriptan if Fioricet is ineffective. - Follow up in 2 months to assess treatment effectiveness.  Chronic Back and Shoulder Pain Chronic lower back and shoulder pain, possible degenerative disc disease. No radicular symptoms. - Prescribe naproxen  for 5 days. Avoid ibuprofen  or aspirin concurrently. - Provide shoulder exercises. - Consider physical therapy if pain persists.  Restless Legs Syndrome Restless legs causing sleep disturbances. Possible iron or nutritional deficiency. Discussed gabapentin  use. - Order labs for iron, B12, and vitamin D  levels. - Prescribe gabapentin  for short-term use at night.  Elevated blood pressure without diagnosis of hypertension Patient to check her blood pressure at home, keep a log, then bring both the log and the blood pressure cuff to her next visit.   Follow-up Follow-up needed to reassess ongoing issues and evaluate treatment effectiveness. - Schedule follow-up appointment at the end of June before her vacation in July. - Discuss lab results and adjust treatment plans as necessary.  Return in about 2 months (around 04/19/2024) for recheck.     I discussed the assessment and treatment plan with the patient  The patient was provided an opportunity to ask questions and all were answered. The patient agreed with the plan and demonstrated an understanding of the instructions.   The patient was advised to call back or seek an in-person evaluation if the symptoms worsen or if the condition fails to improve as anticipated.    Oronde Hallenbeck N Shayla Heming, DO  [facility] [phone] (phone) [phone] (fax)  [facility]

## 2024-02-12 LAB — IRON,TIBC AND FERRITIN PANEL
Ferritin: 32 ng/mL (ref 15–150)
Iron Saturation: 21 % (ref 15–55)
Iron: 65 ug/dL (ref 27–159)
Total Iron Binding Capacity: 313 ug/dL (ref 250–450)
UIBC: 248 ug/dL (ref 131–425)

## 2024-02-12 LAB — COMPREHENSIVE METABOLIC PANEL WITH GFR
ALT: 20 IU/L (ref 0–32)
AST: 19 IU/L (ref 0–40)
Albumin: 4.5 g/dL (ref 3.9–4.9)
Alkaline Phosphatase: 93 IU/L (ref 44–121)
BUN/Creatinine Ratio: 23 (ref 9–23)
BUN: 12 mg/dL (ref 6–24)
Bilirubin Total: 0.3 mg/dL (ref 0.0–1.2)
CO2: 31 mmol/L — ABNORMAL HIGH (ref 20–29)
Calcium: 9.3 mg/dL (ref 8.7–10.2)
Chloride: 97 mmol/L (ref 96–106)
Creatinine, Ser: 0.52 mg/dL — ABNORMAL LOW (ref 0.57–1.00)
Globulin, Total: 3 g/dL (ref 1.5–4.5)
Glucose: 91 mg/dL (ref 70–99)
Potassium: 4 mmol/L (ref 3.5–5.2)
Sodium: 140 mmol/L (ref 134–144)
Total Protein: 7.5 g/dL (ref 6.0–8.5)
eGFR: 118 mL/min/{1.73_m2} (ref >59)

## 2024-02-12 LAB — LIPID PANEL
Chol/HDL Ratio: 2.9 ratio (ref 0.0–4.4)
Cholesterol, Total: 177 mg/dL (ref 100–199)
HDL: 61 mg/dL (ref >39)
LDL Chol Calc (NIH): 97 mg/dL (ref 0–99)
Triglycerides: 104 mg/dL (ref 0–149)
VLDL Cholesterol Cal: 19 mg/dL (ref 5–40)

## 2024-02-12 LAB — CBC
Hematocrit: 42.1 % (ref 34.0–46.6)
Hemoglobin: 13.6 g/dL (ref 11.1–15.9)
MCH: 28.3 pg (ref 26.6–33.0)
MCHC: 32.3 g/dL (ref 31.5–35.7)
MCV: 88 fL (ref 79–97)
Platelets: 282 10*3/uL (ref 150–450)
RBC: 4.81 x10E6/uL (ref 3.77–5.28)
RDW: 11.8 % (ref 11.7–15.4)
WBC: 9.2 10*3/uL (ref 3.4–10.8)

## 2024-02-12 LAB — HIV ANTIBODY (ROUTINE TESTING W REFLEX): HIV Screen 4th Generation wRfx: NONREACTIVE

## 2024-02-12 LAB — B12 AND FOLATE PANEL
Folate: 8.6 ng/mL (ref >3.0)
Vitamin B-12: 163 pg/mL — ABNORMAL LOW (ref 232–1245)

## 2024-02-12 LAB — VITAMIN D 25 HYDROXY (VIT D DEFICIENCY, FRACTURES): Vit D, 25-Hydroxy: 16.6 ng/mL — ABNORMAL LOW (ref 30.0–100.0)

## 2024-02-12 LAB — TSH RFX ON ABNORMAL TO FREE T4: TSH: 1.28 u[IU]/mL (ref 0.450–4.500)

## 2024-02-12 LAB — HEMOGLOBIN A1C
Est. average glucose Bld gHb Est-mCnc: 117 mg/dL
Hgb A1c MFr Bld: 5.7 % — ABNORMAL HIGH (ref 4.8–5.6)

## 2024-02-12 LAB — MAGNESIUM: Magnesium: 1.8 mg/dL (ref 1.6–2.3)

## 2024-02-12 LAB — HEPATITIS C ANTIBODY: Hep C Virus Ab: NONREACTIVE

## 2024-02-21 ENCOUNTER — Encounter: Payer: Self-pay | Admitting: Family Medicine

## 2024-02-21 DIAGNOSIS — Z7689 Persons encountering health services in other specified circumstances: Secondary | ICD-10-CM | POA: Insufficient documentation

## 2024-02-21 DIAGNOSIS — K529 Noninfective gastroenteritis and colitis, unspecified: Secondary | ICD-10-CM | POA: Insufficient documentation

## 2024-02-21 DIAGNOSIS — R030 Elevated blood-pressure reading, without diagnosis of hypertension: Secondary | ICD-10-CM | POA: Insufficient documentation

## 2024-02-21 DIAGNOSIS — R41840 Attention and concentration deficit: Secondary | ICD-10-CM | POA: Insufficient documentation

## 2024-02-21 DIAGNOSIS — M545 Low back pain, unspecified: Secondary | ICD-10-CM | POA: Insufficient documentation

## 2024-02-21 DIAGNOSIS — G43709 Chronic migraine without aura, not intractable, without status migrainosus: Secondary | ICD-10-CM | POA: Insufficient documentation

## 2024-02-21 DIAGNOSIS — Z975 Presence of (intrauterine) contraceptive device: Secondary | ICD-10-CM

## 2024-02-21 DIAGNOSIS — M79673 Pain in unspecified foot: Secondary | ICD-10-CM

## 2024-02-21 DIAGNOSIS — G2581 Restless legs syndrome: Secondary | ICD-10-CM | POA: Insufficient documentation

## 2024-02-21 DIAGNOSIS — F17200 Nicotine dependence, unspecified, uncomplicated: Secondary | ICD-10-CM | POA: Insufficient documentation

## 2024-02-21 DIAGNOSIS — R5382 Chronic fatigue, unspecified: Secondary | ICD-10-CM | POA: Insufficient documentation

## 2024-02-21 DIAGNOSIS — M25511 Pain in right shoulder: Secondary | ICD-10-CM | POA: Insufficient documentation

## 2024-02-21 MED ORDER — VITAMIN D (ERGOCALCIFEROL) 1.25 MG (50000 UNIT) PO CAPS: 50000.0000 [IU] | ORAL_CAPSULE | ORAL | 1 refills | Status: DC

## 2024-02-22 ENCOUNTER — Other Ambulatory Visit: Payer: Self-pay | Admitting: Family Medicine

## 2024-02-22 DIAGNOSIS — G43709 Chronic migraine without aura, not intractable, without status migrainosus: Secondary | ICD-10-CM

## 2024-02-22 DIAGNOSIS — G8929 Other chronic pain: Secondary | ICD-10-CM

## 2024-02-22 DIAGNOSIS — M25511 Pain in right shoulder: Secondary | ICD-10-CM

## 2024-02-24 ENCOUNTER — Other Ambulatory Visit: Payer: Self-pay | Admitting: Family Medicine

## 2024-02-24 DIAGNOSIS — M545 Low back pain, unspecified: Secondary | ICD-10-CM

## 2024-02-24 DIAGNOSIS — G43709 Chronic migraine without aura, not intractable, without status migrainosus: Secondary | ICD-10-CM

## 2024-02-24 DIAGNOSIS — M25511 Pain in right shoulder: Secondary | ICD-10-CM

## 2024-02-25 ENCOUNTER — Other Ambulatory Visit: Payer: Self-pay | Admitting: Family Medicine

## 2024-02-25 DIAGNOSIS — G43709 Chronic migraine without aura, not intractable, without status migrainosus: Secondary | ICD-10-CM

## 2024-02-26 ENCOUNTER — Other Ambulatory Visit: Payer: Self-pay | Admitting: Family Medicine

## 2024-02-26 DIAGNOSIS — M545 Low back pain, unspecified: Secondary | ICD-10-CM

## 2024-02-26 DIAGNOSIS — M25511 Pain in right shoulder: Secondary | ICD-10-CM

## 2024-02-26 DIAGNOSIS — G43709 Chronic migraine without aura, not intractable, without status migrainosus: Secondary | ICD-10-CM

## 2024-02-26 MED ORDER — SUMATRIPTAN SUCCINATE 25 MG PO TABS: 25.0000 mg | ORAL_TABLET | ORAL | 0 refills | Status: DC | PRN

## 2024-02-26 NOTE — Telephone Encounter (Signed)
 Please review

## 2024-02-26 NOTE — Telephone Encounter (Signed)
 [facility] pharmacy is requesting refill butalbital -acetaminophen -caffeine  (FIORICET) 50-325-40 MG tablet  & naproxen  (NAPROSYN ) 500 MG tablet   Please advise

## 2024-02-26 NOTE — Telephone Encounter (Signed)
 Requested medication (s) are due for refill today - last RF request states contact provider- office may need to reach out   Requested medication (s) are on the active medication list -yes  Future visit scheduled -yes  Last refill: 02/11/24 #14  Notes to clinic: non delegated Rx  Requested Prescriptions  Pending Prescriptions Disp Refills   butalbital -acetaminophen -caffeine  (FIORICET) 50-325-40 MG tablet [Pharmacy Med Name: BUT/ACETAMINOPHEN /CAFF 50-325-40 TB] 14 tablet     Sig: TAKE 1 TABLET BY MOUTH EVERY 6 HOURS AS NEEDED FOR HEADACHE     Not Delegated - Analgesics:  Non-Opioid Analgesic Combinations 2 Failed - 02/26/2024  9:29 AM      Failed - This refill cannot be delegated      Failed - Cr in normal range and within 360 days    Creatinine  Date Value Ref Range Status  05/16/2014 0.64 0.60 - 1.30 mg/dL Final   Creatinine, Ser  Date Value Ref Range Status  02/11/2024 0.52 (L) 0.57 - 1.00 mg/dL Final         Failed - Valid encounter within last 12 months    Recent Outpatient Visits           2 weeks ago Attention deficit   [facility] [place], Sarah N, DO              Passed - eGFR is 10 or above and within 360 days    EGFR (African American)  Date Value Ref Range Status  05/16/2014 >60  Final   GFR calc Af Amer  Date Value Ref Range Status  10/09/2016 >60 >60 mL/min Final    Comment:    (NOTE) The eGFR has been calculated using the CKD EPI equation. This calculation has not been validated in all clinical situations. eGFR's persistently <60 mL/min signify possible Chronic Kidney Disease.    EGFR (Non-African Amer.)  Date Value Ref Range Status  05/16/2014 >60  Final    Comment:    eGFR values <66mL/min/1.73 m2 may be an indication of chronic kidney disease (CKD). Calculated eGFR is useful in patients with stable renal function. The eGFR calculation will not be reliable in acutely ill patients when serum creatinine is changing  rapidly. It is not useful in  patients on dialysis. The eGFR calculation may not be applicable to patients at the low and high extremes of body sizes, pregnant women, and vegetarians.    GFR, Estimated  Date Value Ref Range Status  04/29/2023 >60 >60 mL/min Final    Comment:    (NOTE) Calculated using the CKD-EPI Creatinine Equation (2021)    eGFR  Date Value Ref Range Status  02/11/2024 118 >59 mL/min/1.73 Final         Passed - Patient is not pregnant         Requested Prescriptions  Pending Prescriptions Disp Refills   butalbital -acetaminophen -caffeine  (FIORICET) 50-325-40 MG tablet [Pharmacy Med Name: BUT/ACETAMINOPHEN /CAFF 50-325-40 TB] 14 tablet     Sig: TAKE 1 TABLET BY MOUTH EVERY 6 HOURS AS NEEDED FOR HEADACHE     Not Delegated - Analgesics:  Non-Opioid Analgesic Combinations 2 Failed - 02/26/2024  9:29 AM      Failed - This refill cannot be delegated      Failed - Cr in normal range and within 360 days    Creatinine  Date Value Ref Range Status  05/16/2014 0.64 0.60 - 1.30 mg/dL Final   Creatinine, Ser  Date Value Ref Range Status  02/11/2024 0.52 (L)  0.57 - 1.00 mg/dL Final         Failed - Valid encounter within last 12 months    Recent Outpatient Visits           2 weeks ago Attention deficit   [facility] [place], Sarah N, DO              Passed - eGFR is 10 or above and within 360 days    EGFR (African American)  Date Value Ref Range Status  05/16/2014 >60  Final   GFR calc Af Amer  Date Value Ref Range Status  10/09/2016 >60 >60 mL/min Final    Comment:    (NOTE) The eGFR has been calculated using the CKD EPI equation. This calculation has not been validated in all clinical situations. eGFR's persistently <60 mL/min signify possible Chronic Kidney Disease.    EGFR (Non-African Amer.)  Date Value Ref Range Status  05/16/2014 >60  Final    Comment:    eGFR values <71mL/min/1.73 m2 may be an indication of  chronic kidney disease (CKD). Calculated eGFR is useful in patients with stable renal function. The eGFR calculation will not be reliable in acutely ill patients when serum creatinine is changing rapidly. It is not useful in  patients on dialysis. The eGFR calculation may not be applicable to patients at the low and high extremes of body sizes, pregnant women, and vegetarians.    GFR, Estimated  Date Value Ref Range Status  04/29/2023 >60 >60 mL/min Final    Comment:    (NOTE) Calculated using the CKD-EPI Creatinine Equation (2021)    eGFR  Date Value Ref Range Status  02/11/2024 118 >59 mL/min/1.73 Final         Passed - Patient is not pregnant

## 2024-02-27 MED ORDER — SUMATRIPTAN SUCCINATE 25 MG PO TABS: 25.0000 mg | ORAL_TABLET | ORAL | 0 refills | Status: DC | PRN

## 2024-03-01 ENCOUNTER — Telehealth: Payer: Self-pay | Admitting: Family Medicine

## 2024-03-01 DIAGNOSIS — G43709 Chronic migraine without aura, not intractable, without status migrainosus: Secondary | ICD-10-CM

## 2024-03-01 MED ORDER — SUMATRIPTAN SUCCINATE 25 MG PO TABS: 25.0000 mg | ORAL_TABLET | ORAL | 0 refills | Status: DC | PRN

## 2024-03-01 NOTE — Telephone Encounter (Signed)
 [facility] pharmacy is requesting refill SUMAtriptan  (IMITREX ) 25 MG tablet  Please advise

## 2024-03-02 ENCOUNTER — Other Ambulatory Visit: Payer: Self-pay | Admitting: Family Medicine

## 2024-03-02 DIAGNOSIS — G43709 Chronic migraine without aura, not intractable, without status migrainosus: Secondary | ICD-10-CM

## 2024-03-02 MED ORDER — RIZATRIPTAN BENZOATE 5 MG PO TABS: 5.0000 mg | ORAL_TABLET | ORAL | 0 refills | Status: DC | PRN

## 2024-03-03 ENCOUNTER — Telehealth: Payer: Self-pay | Admitting: Family Medicine

## 2024-03-03 NOTE — Telephone Encounter (Signed)
 [facility] pharmacy is requesting refill rizatriptan (MAXALT) 5 MG tablet  [phone] Please advise

## 2024-03-24 NOTE — Patient Instructions (Signed)
 IUD PLACEMENT POST-PROCEDURE INSTRUCTIONS  You may take Ibuprofen, Aleve or Tylenol for pain if needed.  Cramping should resolve within in 24 hours.  You may have a small amount of spotting.  You should wear a mini pad for the next few days.  You may have intercourse after 24 hours.  If you using this for birth control, it is effective immediately.  You need to call if you have any pelvic pain, fever, heavy bleeding or foul smelling vaginal discharge.  Irregular bleeding is common the first several months after having an IUD placed. You do not need to call for this reason unless you are concerned.  Shower or bathe as normal  You should have a follow-up appointment in 4-8 weeks for a re-check to make sure you are not having any problems.    Hormonal Birth Control (Hormonal Contraception): What to Know Hormonal birth control, also called hormonal contraception, is a type of birth control that uses chemicals called hormones to prevent pregnancy. It may include a combination of the hormones estrogen and progesterone, or only the hormone progesterone. Hormonal birth control works in these ways: It thickens the mucus in the cervix, which is the lowest part of the uterus. Thicker mucus makes it harder for sperm to get into the uterus. It changes the lining of the uterus. This makes it harder for an egg to attach or implant. It may stop the ovaries from releasing eggs, called ovulation. Some people who take hormonal birth control that contains only progesterone may continue to ovulate. Hormonal birth control doesn't protect against sexually transmitted infections (STIs). Pregnancy may still happen. Types of hormonal birth control  Estrogen and progesterone birth control Birth control that use a combination of estrogen and progesterone is available as: Pills that come in different combinations of hormones. Pills must be taken at the same time each day. They can affect your period. You can get your  period monthly, once every 3 months, or not at all. A patch that is applied to the butt, belly, upper outer arm, or back. It's kept in place for 3 weeks. It's taken off for the last or fourth week of the menstrual cycle. A vaginal ring. The ring is placed in the vagina and left there for 3 weeks. It's then taken off for the last or fourth week of the cycle. Progesterone-only birth control Birth control that uses only progesterone is available as: Pills. These should be taken at the same time every day. This is very important to decrease the chance of pregnancy. Pills containing progestin-only are usually taken every day of the cycle. Other types of pills may have an inactive pill for the last 4 days of every cycle. Intrauterine device (IUD). This device is inserted through the vagina and cervix into the uterus. It's taken out or replaced every 3 to 8 years, depending on the type. It can be taken out sooner. Implant. A plastic rod is placed under the skin of the upper arm. It is taken out or replaced every 3 years. It can be taken out sooner. Shot, also called injection. The shot is given once every 12 to 14 weeks. Risks associated with hormonal birth control Estrogen and progesterone birth control can sometimes cause side effects, such as: Feeling like you may throw up. Headaches. Breast tenderness. Bleeding or spotting between menstrual cycles. High blood pressure. This is rare. Strokes, heart attacks, or blood clots. These are rare. Progesterone-only birth control can sometimes have side effects, such as: Feeling  like you may throw up. Headaches. Breast tenderness. Irregular menstrual bleeding. High blood pressure. This is rare. Talk to your health care provider about what side effects may mean for you. Questions to ask: What type of hormonal birth control is right for me? How long should I plan to use hormonal birth control? What are the side effects of the hormonal birth control method  I choose? How can I prevent STIs while using hormonal birth control? Where to find more information Ask your provider for more information and resources about hormonal birth control. You can also go to: U.[place]. Department of Health and [employer], Office on Women's Health: http://hoffman.com/ This information is not intended to replace advice given to you by your health care provider. Make sure you discuss any questions you have with your health care provider. Document Revised: 04/28/2023 Document Reviewed: 04/28/2023 Elsevier Patient Education  2024 [employer].

## 2024-03-24 NOTE — Progress Notes (Unsigned)
     GYNECOLOGY OFFICE PROCEDURE NOTE  HANIA CERONE is a 43 y.o. here for  IUD removal and reinsertion. No GYN concerns.  Last pap smear was on unknown and collected today.    IUD Insertion Procedure Note Patient identified, informed consent performed, consent signed.   Discussed risks of irregular bleeding, cramping, infection, malpositioning or misplacement of the IUD outside the uterus which may require further procedure such as laparoscopy. Also discussed >99% contraception efficacy, increased risk of ectopic pregnancy with failure of method.   Emphasized that this did not protect against STIs, condoms recommended during all sexual encounters. Time out was performed.  Urine pregnancy test negative.  Speculum placed in the vagina.  Cervix visualized. IUD was removed with ring forceps and shown to patient. Cervix cleaned with Betadine x 2.  Grasped anteriorly with a single tooth tenaculum.  Uterus sounded to 7 cm.   IUD placed per manufacturer's recommendations.  Strings trimmed to 3 cm. Tenaculum was removed, good hemostasis noted.  Patient tolerated procedure well.   Patient was given post-procedure instructions.  She was advised to have backup contraception for one week.  Patient was also asked to check IUD strings periodically and follow up in 4 weeks for IUD check.    Donato Fu, CNM [facility]

## 2024-03-25 ENCOUNTER — Ambulatory Visit (INDEPENDENT_AMBULATORY_CARE_PROVIDER_SITE_OTHER): Admitting: Certified Nurse Midwife

## 2024-03-25 ENCOUNTER — Encounter: Payer: Self-pay | Admitting: Certified Nurse Midwife

## 2024-03-25 ENCOUNTER — Other Ambulatory Visit (HOSPITAL_COMMUNITY)
Admission: RE | Admit: 2024-03-25 | Discharge: 2024-03-25 | Disposition: A | Discharge status: Home / Self Care | Source: Ambulatory Visit | Attending: Certified Nurse Midwife | Admitting: Certified Nurse Midwife

## 2024-03-25 VITALS — BP 113/88 | HR 84 | Resp 16 | Ht 67.0 in | Wt 262.1 lb

## 2024-03-25 DIAGNOSIS — Z01419 Encounter for gynecological examination (general) (routine) without abnormal findings: Secondary | ICD-10-CM

## 2024-03-25 DIAGNOSIS — Z30430 Encounter for insertion of intrauterine contraceptive device: Secondary | ICD-10-CM

## 2024-03-25 DIAGNOSIS — Z124 Encounter for screening for malignant neoplasm of cervix: Secondary | ICD-10-CM

## 2024-03-25 DIAGNOSIS — Z1231 Encounter for screening mammogram for malignant neoplasm of breast: Secondary | ICD-10-CM

## 2024-03-25 DIAGNOSIS — Z30433 Encounter for removal and reinsertion of intrauterine contraceptive device: Secondary | ICD-10-CM | POA: Diagnosis not present

## 2024-03-25 MED ORDER — LEVONORGESTREL 20 MCG/DAY IU IUD
1.0000 | INTRAUTERINE_SYSTEM | Freq: Once | INTRAUTERINE | Status: AC
  Administered 2024-03-25: 1 via INTRAUTERINE

## 2024-03-25 NOTE — Addendum Note (Signed)
 Addended by: Lendon Queen on: 03/25/2024 05:22 PM   Modules accepted: Orders

## 2024-03-26 LAB — CYTOLOGY - PAP
Comment: NEGATIVE
Diagnosis: NEGATIVE
High risk HPV: NEGATIVE

## 2024-03-29 ENCOUNTER — Other Ambulatory Visit: Payer: Self-pay | Admitting: Family Medicine

## 2024-03-29 DIAGNOSIS — R202 Paresthesia of skin: Secondary | ICD-10-CM

## 2024-03-29 DIAGNOSIS — G2581 Restless legs syndrome: Secondary | ICD-10-CM

## 2024-04-12 ENCOUNTER — Ambulatory Visit: Payer: Self-pay | Admitting: Podiatry

## 2024-04-12 ENCOUNTER — Ambulatory Visit: Admitting: Family Medicine

## 2024-04-12 ENCOUNTER — Ambulatory Visit
Admission: EM | Admit: 2024-04-12 | Discharge: 2024-04-12 | Disposition: A | Discharge status: Home / Self Care | Attending: Physician Assistant | Admitting: Physician Assistant

## 2024-04-12 ENCOUNTER — Ambulatory Visit (INDEPENDENT_AMBULATORY_CARE_PROVIDER_SITE_OTHER)

## 2024-04-12 DIAGNOSIS — R051 Acute cough: Secondary | ICD-10-CM | POA: Insufficient documentation

## 2024-04-12 DIAGNOSIS — R509 Fever, unspecified: Secondary | ICD-10-CM | POA: Diagnosis not present

## 2024-04-12 DIAGNOSIS — B349 Viral infection, unspecified: Secondary | ICD-10-CM | POA: Diagnosis not present

## 2024-04-12 DIAGNOSIS — R059 Cough, unspecified: Secondary | ICD-10-CM | POA: Diagnosis not present

## 2024-04-12 DIAGNOSIS — R0602 Shortness of breath: Secondary | ICD-10-CM | POA: Diagnosis not present

## 2024-04-12 LAB — RESP PANEL BY RT-PCR (RSV, FLU A&B, COVID)  RVPGX2
Influenza A by PCR: NEGATIVE
Influenza B by PCR: NEGATIVE
Resp Syncytial Virus by PCR: NEGATIVE
SARS Coronavirus 2 by RT PCR: NEGATIVE

## 2024-04-12 MED ORDER — PROMETHAZINE-DM 6.25-15 MG/5ML PO SYRP: 5.0000 mL | ORAL_SOLUTION | Freq: Four times a day (QID) | ORAL | 0 refills | Status: DC | PRN

## 2024-04-12 NOTE — Discharge Instructions (Signed)
-  Negative COVID, flu and RSV -Negative chest xray for pneumonia.  URI/COLD SYMPTOMS: Your exam today is consistent with a viral illness. Antibiotics are not indicated at this time. Use medications as directed, including cough syrup, nasal saline, and decongestants. Your symptoms should improve over the next few days and resolve within 7-10 days. Increase rest and fluids. F/u if symptoms worsen or predominate such as sore throat, ear pain, productive cough, shortness of breath, or if you develop high fevers or worsening fatigue over the next several days.

## 2024-04-12 NOTE — ED Triage Notes (Signed)
 Patient presents to UC for cough, congestion, body aches x 3 days. Reports subjective fever. States her wife noted her lips turned blue while sleeping and pulse ox reading was 89-93%. Needs a work note.

## 2024-04-12 NOTE — ED Provider Notes (Signed)
 [facility]    CSN: [id number] Arrival date & time: 04/12/24  9629      History   Chief Complaint Chief Complaint  Patient presents with   Cough   Fever    HPI Karen Harrington is a 43 y.o. female with history of allergies, chronic migraine, chronic fatigue, chronic pain, ADD, restless legs, anxiety and depression. She presents today for feeling feverish with cough, congestion, body aches, shortness of breath, and fatigue x 3 days. Patient says that her spouse told her that her oxygen saturation dropped while she was sleeping. History of bronchitis. Does not use inhalers. Patient says her 2 kids have been ill with similar symptoms.   No history of COPD or asthma. Former smoker.  HPI  Past Medical History:  Diagnosis Date   Allergy    Anxiety    Depression     Patient Active Problem List   Diagnosis Date Noted   Establishing care with new doctor, encounter for 02/21/2024   Attention deficit 02/21/2024   Nicotine dependence with current use 02/21/2024   Elevated blood-pressure reading without diagnosis of hypertension 02/21/2024   Chronic diarrhea 02/21/2024   Acute pain of both shoulders 02/21/2024   Chronic bilateral low back pain without sciatica 02/21/2024   Restless legs 02/21/2024   Chronic fatigue 02/21/2024   Chronic migraine without aura without status migrainosus, not intractable 02/21/2024    History reviewed. No pertinent surgical history.  OB History   No obstetric history on file.      Home Medications    Prior to Admission medications   Medication Sig Start Date End Date Taking? Authorizing Provider  promethazine -dextromethorphan (PROMETHAZINE -DM) 6.25-15 MG/5ML syrup Take 5 mLs by mouth 4 (four) times daily as needed. 04/12/24  Yes Floydene Hy, PA-C  butalbital -acetaminophen -caffeine  (FIORICET) 50-325-40 MG tablet Take 1 tablet by mouth every 6 (six) hours as needed for headache. 02/11/24   Pardue, Asencion Blacksmith, DO  gabapentin  (NEURONTIN )  300 MG capsule Take 1 capsule (300 mg total) by mouth at bedtime as needed and may repeat dose one time if needed. 02/11/24   Carlean Charter, DO  naproxen  (NAPROSYN ) 500 MG tablet Take 1 tablet (500 mg total) by mouth 2 (two) times daily with a meal. 02/11/24   Pardue, Asencion Blacksmith, DO  rizatriptan  (MAXALT ) 5 MG tablet Take 1 tablet (5 mg total) by mouth as needed for migraine. May repeat in 2 hours if needed 03/02/24   Pardue, Sarah N, DO  Vitamin D , Ergocalciferol , (DRISDOL ) 1.25 MG (50000 UNIT) CAPS capsule Take 1 capsule (50,000 Units total) by mouth every 7 (seven) days. 02/21/24   Carlean Charter, DO    Family History Family History  Problem Relation Age of Onset   COPD Mother    Heart failure Mother     Social History Social History   Tobacco Use   Smoking status: Former    Current packs/day: 0.00    Average packs/day: 1 pack/day for 5.0 years (5.0 ttl pk-yrs)    Types: Cigarettes    Start date: 10/28/2004    Quit date: 10/29/2023    Years since quitting: 0.4   Smokeless tobacco: Never  Vaping Use   Vaping status: Every Day  Substance Use Topics   Alcohol use: Yes    Comment: 1 drink a year   Drug use: Never     Allergies   Other   Review of Systems Review of Systems  Constitutional:  Positive for fatigue and fever.  Negative for chills and diaphoresis.  HENT:  Positive for congestion and rhinorrhea. Negative for ear pain, sinus pressure, sinus pain and sore throat.   Respiratory:  Positive for cough and shortness of breath.   Cardiovascular:  Negative for chest pain.  Gastrointestinal:  Negative for abdominal pain, nausea and vomiting.  Musculoskeletal:  Positive for myalgias.  Skin:  Negative for rash.  Neurological:  Negative for weakness and headaches.  Hematological:  Negative for adenopathy.     Physical Exam Triage Vital Signs ED Triage Vitals  Encounter Vitals Group     BP 04/12/24 0940 (!) 145/98     Girls Systolic BP Percentile --      Girls Diastolic BP  Percentile --      Boys Systolic BP Percentile --      Boys Diastolic BP Percentile --      Pulse Rate 04/12/24 0940 93     Resp 04/12/24 0940 18     Temp 04/12/24 0940 99 F (37.2 C)     Temp Source 04/12/24 0940 Oral     SpO2 04/12/24 0940 96 %     Weight --      Height --      Head Circumference --      Peak Flow --      Pain Score 04/12/24 0938 7     Pain Loc --      Pain Education --      Exclude from Growth Chart --    No data found.  Updated Vital Signs BP (!) 145/98 (BP Location: Right Arm)   Pulse 93   Temp 99 F (37.2 C) (Oral)   Resp 18   SpO2 96%    Physical Exam Vitals and nursing note reviewed.  Constitutional:      General: She is not in acute distress.    Appearance: Normal appearance. She is not ill-appearing or toxic-appearing.  HENT:     Head: Normocephalic and atraumatic.     Nose: Congestion present.     Mouth/Throat:     Mouth: Mucous membranes are moist.     Pharynx: Oropharynx is clear.   Eyes:     General: No scleral icterus.       Right eye: No discharge.        Left eye: No discharge.     Conjunctiva/sclera: Conjunctivae normal.    Cardiovascular:     Rate and Rhythm: Normal rate and regular rhythm.     Heart sounds: Normal heart sounds.  Pulmonary:     Effort: Pulmonary effort is normal. No respiratory distress.     Breath sounds: Normal breath sounds.   Musculoskeletal:     Cervical back: Neck supple.   Skin:    General: Skin is dry.   Neurological:     General: No focal deficit present.     Mental Status: She is alert. Mental status is at baseline.     Motor: No weakness.     Gait: Gait normal.   Psychiatric:        Mood and Affect: Mood normal.        Behavior: Behavior normal.      UC Treatments / Results  Labs (all labs ordered are listed, but only abnormal results are displayed) Labs Reviewed  RESP PANEL BY RT-PCR (RSV, FLU A&B, COVID)  [id number]    EKG   Radiology DG Chest 2 View Result Date:  04/12/2024 CLINICAL DATA:  Three day history of fever, cough, shortness  of breath EXAM: CHEST - 2 VIEW COMPARISON:  Chest radiograph dated 11/03/2020, CT chest dated 08/21/2008 FINDINGS: Normal lung volumes. Similar bilateral scarring. No new focal consolidations. No pleural effusion or pneumothorax. Similar mildly enlarged cardiomediastinal silhouette. no acute osseous abnormality. IMPRESSION: 1. Similar bilateral scarring. No new focal consolidations. 2. Similar mild cardiomegaly. Electronically Signed   By: Limin  Xu M.D.   On: 04/12/2024 10:38    Procedures Procedures (including critical care time)  Medications Ordered in UC Medications - No data to display  Initial Impression / Assessment and Plan / UC Course  I have reviewed the triage vital signs and the nursing notes.  Pertinent labs & imaging results that were available during my care of the patient were reviewed by me and considered in my medical decision making (see chart for details).   43 y/o female presents for 3 day history of feeling feverish with fatigue, cough, congestion, and body aches. Also reports shortness of breath.  BP elevated 145/98. Other vitals are normal and stable. Patient is overall well appearing and NAD. On exam, she has nasal congestion. Throat is clear. Chest is clear. Heart RRR.  Resp panel and chest x-ray ordered.  CXR shows lung scarring but no new consolidations.   Resp panel all negative.   Viral illness. Supportive care encouraged. Sent promethazine  DM to pharmacy. Reviewed rest and increase fluids. Advised returning to work when no longer feeling feverish. Reviewed return precautions.   Final Clinical Impressions(s) / UC Diagnoses   Final diagnoses:  Acute cough  Viral illness  Feels feverish  Shortness of breath     Discharge Instructions      -Negative COVID, flu and RSV -Negative chest xray for pneumonia.  URI/COLD SYMPTOMS: Your exam today is consistent with a viral illness.  Antibiotics are not indicated at this time. Use medications as directed, including cough syrup, nasal saline, and decongestants. Your symptoms should improve over the next few days and resolve within 7-10 days. Increase rest and fluids. F/u if symptoms worsen or predominate such as sore throat, ear pain, productive cough, shortness of breath, or if you develop high fevers or worsening fatigue over the next several days.       ED Prescriptions     Medication Sig Dispense Auth. Provider   promethazine -dextromethorphan (PROMETHAZINE -DM) 6.25-15 MG/5ML syrup Take 5 mLs by mouth 4 (four) times daily as needed. 118 mL Floydene Hy, PA-C      PDMP not reviewed this encounter.   Floydene Hy, PA-C 04/12/24 1128

## 2024-04-22 ENCOUNTER — Encounter: Payer: Self-pay | Admitting: Family Medicine

## 2024-04-22 ENCOUNTER — Ambulatory Visit: Admitting: Podiatry

## 2024-04-22 ENCOUNTER — Ambulatory Visit (INDEPENDENT_AMBULATORY_CARE_PROVIDER_SITE_OTHER): Admitting: Family Medicine

## 2024-04-22 VITALS — BP 123/86 | HR 86 | Resp 14 | Wt 259.6 lb

## 2024-04-22 DIAGNOSIS — R030 Elevated blood-pressure reading, without diagnosis of hypertension: Secondary | ICD-10-CM

## 2024-04-22 DIAGNOSIS — Z1231 Encounter for screening mammogram for malignant neoplasm of breast: Secondary | ICD-10-CM

## 2024-04-22 DIAGNOSIS — B351 Tinea unguium: Secondary | ICD-10-CM | POA: Diagnosis not present

## 2024-04-22 DIAGNOSIS — K529 Noninfective gastroenteritis and colitis, unspecified: Secondary | ICD-10-CM | POA: Diagnosis not present

## 2024-04-22 DIAGNOSIS — Z79899 Other long term (current) drug therapy: Secondary | ICD-10-CM | POA: Diagnosis not present

## 2024-04-22 DIAGNOSIS — G43709 Chronic migraine without aura, not intractable, without status migrainosus: Secondary | ICD-10-CM | POA: Diagnosis not present

## 2024-04-22 DIAGNOSIS — E538 Deficiency of other specified B group vitamins: Secondary | ICD-10-CM

## 2024-04-22 DIAGNOSIS — R0683 Snoring: Secondary | ICD-10-CM

## 2024-04-22 DIAGNOSIS — G479 Sleep disorder, unspecified: Secondary | ICD-10-CM

## 2024-04-22 DIAGNOSIS — G8929 Other chronic pain: Secondary | ICD-10-CM

## 2024-04-22 DIAGNOSIS — G4730 Sleep apnea, unspecified: Secondary | ICD-10-CM | POA: Diagnosis not present

## 2024-04-22 DIAGNOSIS — M5136 Other intervertebral disc degeneration, lumbar region with discogenic back pain only: Secondary | ICD-10-CM

## 2024-04-22 DIAGNOSIS — M25512 Pain in left shoulder: Secondary | ICD-10-CM

## 2024-04-22 DIAGNOSIS — Q666 Other congenital valgus deformities of feet: Secondary | ICD-10-CM | POA: Diagnosis not present

## 2024-04-22 DIAGNOSIS — G4700 Insomnia, unspecified: Secondary | ICD-10-CM

## 2024-04-22 DIAGNOSIS — M25511 Pain in right shoulder: Secondary | ICD-10-CM

## 2024-04-22 MED ORDER — MELOXICAM 15 MG PO TABS
15.0000 mg | ORAL_TABLET | Freq: Every day | ORAL | 0 refills | Status: DC
Start: 2024-04-22 — End: 2024-08-30

## 2024-04-22 MED ORDER — RIZATRIPTAN BENZOATE 10 MG PO TABS: 10.0000 mg | ORAL_TABLET | ORAL | 0 refills | Status: DC | PRN

## 2024-04-22 MED ORDER — COLESEVELAM HCL 625 MG PO TABS: 625.0000 mg | ORAL_TABLET | Freq: Two times a day (BID) | ORAL | 1 refills | Status: DC

## 2024-04-22 MED ORDER — PROPRANOLOL HCL ER 60 MG PO CP24: 60.0000 mg | ORAL_CAPSULE | Freq: Every day | ORAL | 1 refills | Status: DC

## 2024-04-22 MED ORDER — TRAZODONE HCL 50 MG PO TABS: 50.0000 mg | ORAL_TABLET | Freq: Every day | ORAL | 1 refills | Status: DC

## 2024-04-22 MED ORDER — VITAMIN B-12 1000 MCG PO TABS: 1000.0000 ug | ORAL_TABLET | Freq: Every day | ORAL | 1 refills | Status: AC

## 2024-04-22 NOTE — Progress Notes (Signed)
 Established patient visit   Patient: Karen Harrington   DOB: [birth year]   43 y.o. Female  MRN: [id number] Visit Date: 04/22/2024  Today's healthcare provider: LAURAINE LOISE BUOY, DO   Chief Complaint  Patient presents with   Follow-up    2 month follow-up.   Subjective    HPI Karen Harrington is a 43 year old female with migraines and gastrointestinal issues who presents with persistent chronic postprandial diarrhea and migraines.  She experiences persistent migraines that have not improved with current medications, including Fioricet and rizatriptan . The headaches cause wooziness, tiredness, and a preference for dark, quiet environments. These migraines have been ongoing for years.  She has persistent diarrhea that occurs shortly after eating any type of food, including salads. The diarrhea is watery and sometimes black or yellow in color. This has been ongoing for a long time and is reportedly worsening. She recalls someone who recently mentioned IBS to her.  She has experienced episodes of bright red blood in her stool, for which she visited the ER. She has not yet seen a gastroenterologist but has an appointment scheduled for August.  She reports leg pain that feels like 'one big bruise' and is painful to touch. This has been ongoing for a while. She also experiences restless legs and has been taking gabapentin , which she sometimes doubles up on for relief.  She has a history of degenerative disc disease diagnosed in 2005, causing significant lower back pain, especially when lifting or bending. This pain is described as excruciating, particularly in the mornings.  She inquiring whether is consulted with restriction related to this pain, as she works at a [employer], learning disability and has to bend and lift throughout the day.  She reports difficulty sleeping and has been using a combination of gabapentin  and trazodone to help with sleep. She notes that she sometimes stops breathing during sleep, as  observed by her wife, and has not been tested for sleep apnea.  She experiences chronic shoulder pain, which is exacerbated by weight-bearing activities and has been ongoing for a while. She reports taking 800 mg ibuprofen  for pain relief.  She continues to vape and has a history of smoking cessation attempts.      Medications: Outpatient Medications Prior to Visit  Medication Sig   gabapentin  (NEURONTIN ) 300 MG capsule Take 1 capsule (300 mg total) by mouth at bedtime as needed and may repeat dose one time if needed.   promethazine -dextromethorphan (PROMETHAZINE -DM) 6.25-15 MG/5ML syrup Take 5 mLs by mouth 4 (four) times daily as needed.   Vitamin D , Ergocalciferol , (DRISDOL ) 1.25 MG (50000 UNIT) CAPS capsule Take 1 capsule (50,000 Units total) by mouth every 7 (seven) days.   [DISCONTINUED] butalbital -acetaminophen -caffeine  (FIORICET) 50-325-40 MG tablet Take 1 tablet by mouth every 6 (six) hours as needed for headache.   [DISCONTINUED] naproxen  (NAPROSYN ) 500 MG tablet Take 1 tablet (500 mg total) by mouth 2 (two) times daily with a meal.   [DISCONTINUED] rizatriptan  (MAXALT ) 5 MG tablet Take 1 tablet (5 mg total) by mouth as needed for migraine. May repeat in 2 hours if needed   No facility-administered medications prior to visit.        Objective    BP 123/86 (BP Location: Left Arm, Patient Position: Sitting, Cuff Size: Large)   Pulse 86   Resp 14   Wt 259 lb 9.6 oz (117.8 kg)   SpO2 95%   BMI 40.66 kg/m     Physical Exam Vitals  and nursing note reviewed.  Constitutional:      General: She is not in acute distress.    Appearance: Normal appearance.  HENT:     Head: Normocephalic and atraumatic.   Eyes:     General: No scleral icterus.    Conjunctiva/sclera: Conjunctivae normal.    Cardiovascular:     Rate and Rhythm: Normal rate.  Pulmonary:     Effort: Pulmonary effort is normal.   Neurological:     Mental Status: She is alert and oriented to person,  place, and time. Mental status is at baseline.   Psychiatric:        Mood and Affect: Mood normal.        Behavior: Behavior normal.      No results found for any visits on 04/22/24.  Assessment & Plan    Chronic migraine without aura without status migrainosus, not intractable -     Propranolol HCl ER; Take 1 capsule (60 mg total) by mouth daily.  Dispense: 30 capsule; Refill: 1 -     Rizatriptan  Benzoate; Take 1 tablet (10 mg total) by mouth as needed for migraine. May repeat in 2 hours if needed  Dispense: 10 tablet; Refill: 0  Chronic diarrhea -     Colesevelam HCl; Take 1 tablet (625 mg total) by mouth 2 (two) times daily with a meal.  Dispense: 60 tablet; Refill: 1  Elevated blood-pressure reading without diagnosis of hypertension  Observed sleep apnea -     Home sleep test  Loud snoring -     Home sleep test  Restless sleeper -     Home sleep test  Insomnia, unspecified type -     traZODone HCl; Take 1-1.5 tablets (50-75 mg total) by mouth at bedtime.  Dispense: 135 tablet; Refill: 1  Acute pain of both shoulders -     Ambulatory referral to Physical Therapy -     Ambulatory referral to Orthopedic Surgery -     Meloxicam; Take 1 tablet (15 mg total) by mouth daily.  Dispense: 60 tablet; Refill: 0  Chronic bilateral low back pain without sciatica -     Ambulatory referral to Orthopedic Surgery -     Meloxicam; Take 1 tablet (15 mg total) by mouth daily.  Dispense: 60 tablet; Refill: 0  Degeneration of intervertebral disc of lumbar region with discogenic back pain  Vitamin B12 deficiency -     Vitamin B-12; Take 1 tablet (1,000 mcg total) by mouth daily.  Dispense: 90 tablet; Refill: 1  Encounter for screening mammogram for breast cancer -     3D Screening Mammogram, Left and Right; Future     Chronic Migraines Chronic migraines unresponsive to Fioricet and mild improvement with low-dose rizatriptan . Propranolol prescribed for prophylaxis, increased  rizatriptan  to 10 mg for acute episodes. - Prescribe propranolol for daily migraine prevention. - Increase rizatriptan  to 10 mg daily as needed for acute migraine management.  Chronic Diarrhea Chronic diarrhea with history of hematochezia, worsening over time.  Possible previous IBS diagnosis.  Colesevelam prescribed pending gastroenterology evaluation. - Prescribe colesevelam for diarrhea management. - Advise monitoring stool color in relation to Pepto-Bismol use, as it is likely the source of her intermittent black stools. - Keep scheduled gastroenterology appointment for further evaluation.  Elevated blood pressure without diagnosis of hypertension Blood pressure fairly well-controlled today.  No acute concerns.  Will continue to monitor.  Sleep Disturbance; loud snoring; observed apnea Sleep disturbance due to restless legs and suspected sleep apnea.  Referral for home sleep study. Discussed CPAP use if sleep apnea confirmed. - Prescribe trazodone 50 mg, start with half tablet, adjust as needed up to 1.5 tablets nightly. - Continue gabapentin  for restless legs. - Refer for home sleep study for sleep apnea evaluation.  Chronic Shoulder Pain; chronic low back pain; degenerative disc disease Chronic shoulder pain likely from rotator cuff tear, managed with ibuprofen  800 mg 1-2 times daily. Referral to physical therapy to avoid surgery. - Refer to physical therapy for shoulder pain management. - Prescribe meloxicam for pain management, advise against concurrent use with ibuprofen  or aspirin.  Degenerative Disc Disease Degenerative disc disease with significant lower back pain affecting daily activities. Referral to orthopedics for functional capacity evaluation. - Refer to orthopedics for functional capacity evaluation and potential work restrictions.  Vitamin B12 deficiency Patient prefers oral supplementation to injectable; will start oral vitamin B12 at 1000 mcg daily.  General  Health Maintenance Uncertain hepatitis B and HPV vaccination status. Advised to verify records and initiate vaccinations if incomplete. - Query immunization registry for hepatitis B and HPV vaccination status. - Recommend starting hepatitis B and HPV vaccinations if not up to date.    Return in about 4 weeks (around 05/20/2024) for Chronic f/u.  May delay to 3 months if symptoms are improving.     I discussed the assessment and treatment plan with the patient  The patient was provided an opportunity to ask questions and all were answered. The patient agreed with the plan and demonstrated an understanding of the instructions.   The patient was advised to call back or seek an in-person evaluation if the symptoms worsen or if the condition fails to improve as anticipated.     LAURAINE LOISE BUOY, DO  [facility] [phone] (phone) [phone] (fax)  [facility]

## 2024-04-22 NOTE — Progress Notes (Signed)
 Subjective:  Patient ID: Karen Harrington, female    DOB: [birth year],  MRN: [id number]  Chief Complaint  Patient presents with   Nail Problem    Nail discoloration place on side of left foot causing some discomfort     43 y.o. female presents with the above complaint.  Patient presents with thickened and onychodystrophy mycotic toenails x 10.  She wanted get it evaluated.  She has tried some over-the-counter topical medication but nothing has helped.  She wanted to discuss treatment options for this.  She also has flatfoot deformity and does a lot of work on her feet.  She wanted to discuss orthotics option as well.  Denies any other acute issues.   Review of Systems: Negative except as noted in the HPI. Denies N/V/F/Ch.  Past Medical History:  Diagnosis Date   Allergy    Anxiety    Depression     Current Outpatient Medications:    colesevelam (WELCHOL) 625 MG tablet, Take 1 tablet (625 mg total) by mouth 2 (two) times daily with a meal., Disp: 60 tablet, Rfl: 1   cyanocobalamin (VITAMIN B12) 1000 MCG tablet, Take 1 tablet (1,000 mcg total) by mouth daily., Disp: 90 tablet, Rfl: 1   gabapentin  (NEURONTIN ) 300 MG capsule, Take 1 capsule (300 mg total) by mouth at bedtime as needed and may repeat dose one time if needed., Disp: 60 capsule, Rfl: 0   meloxicam (MOBIC) 15 MG tablet, Take 1 tablet (15 mg total) by mouth daily., Disp: 60 tablet, Rfl: 0   promethazine -dextromethorphan (PROMETHAZINE -DM) 6.25-15 MG/5ML syrup, Take 5 mLs by mouth 4 (four) times daily as needed., Disp: 118 mL, Rfl: 0   propranolol ER (INDERAL LA) 60 MG 24 hr capsule, Take 1 capsule (60 mg total) by mouth daily., Disp: 30 capsule, Rfl: 1   rizatriptan  (MAXALT ) 10 MG tablet, Take 1 tablet (10 mg total) by mouth as needed for migraine. May repeat in 2 hours if needed, Disp: 10 tablet, Rfl: 0   traZODone (DESYREL) 50 MG tablet, Take 1-1.5 tablets (50-75 mg total) by mouth at bedtime., Disp: 135 tablet, Rfl: 1   Vitamin  D, Ergocalciferol , (DRISDOL ) 1.25 MG (50000 UNIT) CAPS capsule, Take 1 capsule (50,000 Units total) by mouth every 7 (seven) days., Disp: 12 capsule, Rfl: 1  Social History   Tobacco Use  Smoking Status Former   Current packs/day: 0.00   Average packs/day: 1 pack/day for 5.0 years (5.0 ttl pk-yrs)   Types: Cigarettes   Start date: 10/28/2004   Quit date: 10/29/2023   Years since quitting: 0.4  Smokeless Tobacco Never    Allergies  Allergen Reactions   Other Other (See Comments)    Eggs   Objective:  There were no vitals filed for this visit. There is no height or weight on file to calculate BMI. Constitutional Well developed. Well nourished.  Vascular Dorsalis pedis pulses palpable bilaterally. Posterior tibial pulses palpable bilaterally. Capillary refill normal to all digits.  No cyanosis or clubbing noted. Pedal hair growth normal.  Neurologic Normal speech. Oriented to person, place, and time. Epicritic sensation to light touch grossly present bilaterally.  Dermatologic Nails thickened and onychodystrophy mycotic toenails x 10 Skin within normal limits  Orthopedic: Normal joint ROM without pain or crepitus bilaterally. No visible deformities. No bony tenderness.   Radiographs: None Assessment:   1. Pes planovalgus   2. Long-term use of high-risk medication   3. Nail fungus   4. Onychomycosis due to dermatophyte  Plan:  Patient was evaluated and treated and all questions answered.  Onychomycosis toenails x 10 -Educated the patient on the etiology of onychomycosis and various treatment options associated with improving the fungal load.  I explained to the patient that there is 3 treatment options available to treat the onychomycosis including topical, p.o., laser treatment.  Patient elected to undergo p.o. options with Lamisil/terbinafine therapy.  In order for me to start the medication therapy, I explained to the patient the importance of evaluating the liver and  obtaining the liver function test.  Once the liver function test comes back normal I will start him on 25-month course of Lamisil therapy.  Patient understood all risk and would like to proceed with Lamisil therapy.  I have asked the patient to immediately stop the Lamisil therapy if she has any reactions to it and call the office or go to the emergency room right away.  Patient states understanding  Pes planovalgus -I explained to patient the etiology of pes planovalgus and relationship with Planter fasciitis and various treatment options were discussed.  Given patient foot structure in the setting of Planter fasciitis I believe patient will benefit from custom-made orthotics to help control the hindfoot motion support the arch of the foot and take the stress away from plantar fascial.  Patient agrees with the plan like to proceed with orthotics -Patient was casted for orthotics   No follow-ups on file.

## 2024-04-22 NOTE — Patient Instructions (Signed)
 Please call the [facility] [phone]) to schedule a routine screening mammogram.

## 2024-04-23 ENCOUNTER — Encounter: Payer: Self-pay | Admitting: Family Medicine

## 2024-05-03 ENCOUNTER — Other Ambulatory Visit: Payer: Self-pay | Admitting: Family Medicine

## 2024-05-03 DIAGNOSIS — G43709 Chronic migraine without aura, not intractable, without status migrainosus: Secondary | ICD-10-CM

## 2024-05-03 MED ORDER — RIZATRIPTAN BENZOATE 10 MG PO TBDP: 10.0000 mg | ORAL_TABLET | ORAL | 0 refills | Status: DC | PRN

## 2024-05-19 ENCOUNTER — Telehealth: Payer: Self-pay

## 2024-05-19 NOTE — Telephone Encounter (Signed)
 LVM to schedule orthotic fitting/ orthotics in [facility] bin  Balance: $98.81

## 2024-06-17 DIAGNOSIS — G8929 Other chronic pain: Secondary | ICD-10-CM | POA: Diagnosis not present

## 2024-06-17 DIAGNOSIS — R1031 Right lower quadrant pain: Secondary | ICD-10-CM | POA: Diagnosis not present

## 2024-06-17 DIAGNOSIS — K529 Noninfective gastroenteritis and colitis, unspecified: Secondary | ICD-10-CM | POA: Diagnosis not present

## 2024-06-17 DIAGNOSIS — R1032 Left lower quadrant pain: Secondary | ICD-10-CM | POA: Diagnosis not present

## 2024-06-17 DIAGNOSIS — R152 Fecal urgency: Secondary | ICD-10-CM | POA: Diagnosis not present

## 2024-06-17 DIAGNOSIS — K920 Hematemesis: Secondary | ICD-10-CM | POA: Diagnosis not present

## 2024-07-01 ENCOUNTER — Encounter: Payer: Self-pay | Admitting: Gastroenterology

## 2024-07-02 ENCOUNTER — Encounter: Payer: Self-pay | Admitting: Gastroenterology

## 2024-07-02 ENCOUNTER — Ambulatory Visit

## 2024-07-02 ENCOUNTER — Encounter
Admission: RE | Disposition: A | Discharge status: Home / Self Care | Payer: Self-pay | Source: Home / Self Care | Attending: Gastroenterology

## 2024-07-02 ENCOUNTER — Ambulatory Visit
Admission: RE | Admit: 2024-07-02 | Discharge: 2024-07-02 | Disposition: A | Discharge status: Home / Self Care | Attending: Gastroenterology | Admitting: Gastroenterology

## 2024-07-02 DIAGNOSIS — K3189 Other diseases of stomach and duodenum: Secondary | ICD-10-CM | POA: Diagnosis not present

## 2024-07-02 DIAGNOSIS — K529 Noninfective gastroenteritis and colitis, unspecified: Secondary | ICD-10-CM | POA: Insufficient documentation

## 2024-07-02 DIAGNOSIS — K2950 Unspecified chronic gastritis without bleeding: Secondary | ICD-10-CM | POA: Insufficient documentation

## 2024-07-02 DIAGNOSIS — K648 Other hemorrhoids: Secondary | ICD-10-CM | POA: Diagnosis not present

## 2024-07-02 DIAGNOSIS — K921 Melena: Secondary | ICD-10-CM | POA: Diagnosis not present

## 2024-07-02 DIAGNOSIS — K317 Polyp of stomach and duodenum: Secondary | ICD-10-CM | POA: Diagnosis not present

## 2024-07-02 DIAGNOSIS — B9681 Helicobacter pylori [H. pylori] as the cause of diseases classified elsewhere: Secondary | ICD-10-CM | POA: Diagnosis not present

## 2024-07-02 DIAGNOSIS — K920 Hematemesis: Secondary | ICD-10-CM | POA: Diagnosis not present

## 2024-07-02 DIAGNOSIS — R1032 Left lower quadrant pain: Secondary | ICD-10-CM | POA: Diagnosis not present

## 2024-07-02 DIAGNOSIS — R1031 Right lower quadrant pain: Secondary | ICD-10-CM | POA: Diagnosis not present

## 2024-07-02 HISTORY — PX: COLONOSCOPY: SHX5424

## 2024-07-02 HISTORY — PX: ESOPHAGOGASTRODUODENOSCOPY: SHX5428

## 2024-07-02 HISTORY — DX: Gastro-esophageal reflux disease without esophagitis: K21.9

## 2024-07-02 HISTORY — DX: Unspecified osteoarthritis, unspecified site: M19.90

## 2024-07-02 HISTORY — DX: Anemia, unspecified: D64.9

## 2024-07-02 LAB — POCT PREGNANCY, URINE: Preg Test, Ur: NEGATIVE

## 2024-07-02 SURGERY — COLONOSCOPY
Anesthesia: General

## 2024-07-02 MED ORDER — SODIUM CHLORIDE 0.9 % IV SOLN: INTRAVENOUS | Status: DC

## 2024-07-02 MED ORDER — LIDOCAINE HCL (CARDIAC) PF 100 MG/5ML IV SOSY
PREFILLED_SYRINGE | INTRAVENOUS | Status: DC | PRN
  Administered 2024-07-02 (×2): 100 mg via INTRAVENOUS

## 2024-07-02 MED ORDER — PROPOFOL 500 MG/50ML IV EMUL
INTRAVENOUS | Status: DC | PRN
  Administered 2024-07-02: 125 ug/kg/min via INTRAVENOUS

## 2024-07-02 MED ORDER — LIDOCAINE HCL (PF) 2 % IJ SOLN
INTRAMUSCULAR | Status: AC
  Filled 2024-07-02: qty 5

## 2024-07-02 MED ORDER — PROPOFOL 10 MG/ML IV BOLUS
INTRAVENOUS | Status: DC | PRN
  Administered 2024-07-02: 50 mg via INTRAVENOUS
  Administered 2024-07-02: 200 mg via INTRAVENOUS
  Administered 2024-07-02 (×2): 50 mg via INTRAVENOUS

## 2024-07-02 MED ORDER — PROPOFOL 1000 MG/100ML IV EMUL
INTRAVENOUS | Status: AC
  Filled 2024-07-02: qty 100

## 2024-07-02 NOTE — Op Note (Signed)
 [facility] Patient Name: Karen Harrington Procedure Date: 07/02/2024 10:56 AM MRN: [id number] Account #: [id number] Date of Birth: [birth year] Admit Type: Outpatient Age: 43 Room: [facility] ENDO ROOM 1 Gender: Female Note Status: Finalized Instrument Name: Colon Scope [id number] Procedure:             Colonoscopy Indications:           Chronic diarrhea, chronic bilateral lower abdominal                         pain, fecal urgency, hematochezia Providers:             Elspeth Ozell Jungling DO, DO Medicines:             Monitored Anesthesia Care Complications:         No immediate complications. Estimated blood loss:                         Minimal. Procedure:             Pre-Anesthesia Assessment:                        - Prior to the procedure, a History and Physical was                         performed, and patient medications and allergies were                         reviewed. The patient is competent. The risks and                         benefits of the procedure and the sedation options and                         risks were discussed with the patient. All questions                         were answered and informed consent was obtained.                         Patient identification and proposed procedure were                         verified by the physician, the nurse, the anesthetist                         and the technician in the endoscopy suite. Mental                         Status Examination: alert and oriented. Airway                         Examination: normal oropharyngeal airway and neck                         mobility. Respiratory Examination: clear to                         auscultation. CV Examination: RRR, no murmurs, no S3  or S4. Prophylactic Antibiotics: The patient does not                         require prophylactic antibiotics. Prior                         Anticoagulants: The patient has taken no  anticoagulant                         or antiplatelet agents. ASA Grade Assessment: II - A                         patient with mild systemic disease. After reviewing                         the risks and benefits, the patient was deemed in                         satisfactory condition to undergo the procedure. The                         anesthesia plan was to use monitored anesthesia care                         (MAC). Immediately prior to administration of                         medications, the patient was re-assessed for adequacy                         to receive sedatives. The heart rate, respiratory                         rate, oxygen saturations, blood pressure, adequacy of                         pulmonary ventilation, and response to care were                         monitored throughout the procedure. The physical                         status of the patient was re-assessed after the                         procedure.                        After obtaining informed consent, the colonoscope was                         passed under direct vision. Throughout the procedure,                         the patient's blood pressure, pulse, and oxygen                         saturations were monitored continuously. The was  introduced through the anus and advanced to the the                         terminal ileum, with identification of the appendiceal                         orifice and IC valve. The colonoscopy was performed                         without difficulty. The patient tolerated the                         procedure well. The quality of the bowel preparation                         was evaluated using the BBPS [facility] Bowel Preparation                         Scale) with scores of: Right Colon = 3, Transverse                         Colon = 3 and Left Colon = 3 (entire mucosa seen well                         with no residual staining, small  fragments of stool or                         opaque liquid). The total BBPS score equals 9. The                         terminal ileum, ileocecal valve, appendiceal orifice,                         and rectum were photographed. Findings:      The perianal and digital rectal examinations were normal. Pertinent       negatives include normal sphincter tone.      The terminal ileum appeared normal. Biopsies were taken with a cold       forceps for histology. Estimated blood loss was minimal.      Retroflexion in the right colon was performed.      Normal mucosa was found in the entire colon. Biopsies were taken with a       cold forceps for histology. Biopsies for histology were taken with a       cold forceps from the right colon, left colon and transverse colon for       evaluation of microscopic colitis. Estimated blood loss was minimal.      Non-bleeding internal hemorrhoids were found during retroflexion. The       hemorrhoids were small. Estimated blood loss: none.      No signs of inflammatory bowel disease. Estimated blood loss: none. Impression:            - The examined portion of the ileum was normal.                         Biopsied.                        -  Normal mucosa in the entire examined colon. Biopsied.                        - Non-bleeding internal hemorrhoids. Recommendation:        - Patient has a contact number available for                         emergencies. The signs and symptoms of potential                         delayed complications were discussed with the patient.                         Return to normal activities tomorrow. Written                         discharge instructions were provided to the patient.                        - Discharge patient to home.                        - Resume previous diet.                        - Continue present medications.                        - Avoid non-steroidals. Will need to hold at least 5                          days post procedures                        - Await pathology results.                        - If concern for IBD still present after pathology                         returns, consider VCE, CTE or MRE to evaluate small                         bowel.                        - Consider SIBO and sugar intolerance testing for                         diarrhea.                        Keep food and symptom log to assess if anything                         exacerbates symptoms.                        Recommend FODMAP diet.                        - Return to  GI office as previously scheduled.                        - The findings and recommendations were discussed with                         the patient.                        - The findings and recommendations were discussed with                         the patient's family. Procedure Code(s):     --- Professional ---                        [phone], Colonoscopy, flexible; with biopsy, single or                         multiple Diagnosis Code(s):     --- Professional ---                        K64.8, Other hemorrhoids CPT copyright 2022 American Medical Association. All rights reserved. The codes documented in this report are preliminary and upon coder review may  be revised to meet current compliance requirements. Attending Participation:      I personally performed the entire procedure. Elspeth Jungling, DO Elspeth Ozell Jungling DO, DO 07/02/2024 12:28:06 PM This report has been signed electronically. Number of Addenda: 0 Note Initiated On: 07/02/2024 10:56 AM Scope Withdrawal Time: 0 hours 9 minutes 29 seconds  Total Procedure Duration: 0 hours 11 minutes 4 seconds  Estimated Blood Loss:  Estimated blood loss was minimal.      [facility]

## 2024-07-02 NOTE — Interval H&P Note (Signed)
 History and Physical Interval Note: Preprocedure H&P from 07/02/24  was reviewed and there was no interval change after seeing and examining the patient.  Written consent was obtained from the patient after discussion of risks, benefits, and alternatives. Patient has consented to proceed with Esophagogastroduodenoscopy and Colonoscopy with possible intervention   07/02/2024 11:43 AM  Warren CHRISTELLA Daring  has presented today for surgery, with the diagnosis of K52.9  - Chronic diarrhea R10.31, G89.29, R10.32  - Chronic bilateral lower abdominal pain R15.2  - Fecal urgency K92.0  - Hematemesis with nausea K92.1  - Hematochezia.  The various methods of treatment have been discussed with the patient and family. After consideration of risks, benefits and other options for treatment, the patient has consented to  Procedure(s): COLONOSCOPY (N/A) EGD (ESOPHAGOGASTRODUODENOSCOPY) (N/A) as a surgical intervention.  The patient's history has been reviewed, patient examined, no change in status, stable for surgery.  I have reviewed the patient's chart and labs.  Questions were answered to the patient's satisfaction.     Elspeth Ozell Jungling

## 2024-07-02 NOTE — Anesthesia Preprocedure Evaluation (Signed)
 Anesthesia Evaluation  Patient identified by MRN, date of birth, ID band Patient awake    Reviewed: Allergy & Precautions, NPO status , Patient's Chart, lab work & pertinent test results  History of Anesthesia Complications Negative for: history of anesthetic complications  Airway Mallampati: III  TM Distance: <3 FB Neck ROM: full    Dental  (+) Chipped   Pulmonary neg shortness of breath, Patient abstained from smoking., former smoker   Pulmonary exam normal        Cardiovascular Exercise Tolerance: Good (-) angina (-) Past MI negative cardio ROS Normal cardiovascular exam     Neuro/Psych  Headaches  negative psych ROS   GI/Hepatic Neg liver ROS,GERD  Controlled,,  Endo/Other  negative endocrine ROS    Renal/GU negative Renal ROS  negative genitourinary   Musculoskeletal   Abdominal   Peds  Hematology negative hematology ROS (+)   Anesthesia Other Findings Past Medical History: No date: Allergy No date: Anemia No date: Anxiety No date: Depression No date: DJD (degenerative joint disease) No date: GERD (gastroesophageal reflux disease)  Past Surgical History: No date: LITHOTRIPSY  BMI    Body Mass Index: 38.87 kg/m      Reproductive/Obstetrics negative OB ROS                              Anesthesia Physical Anesthesia Plan  ASA: 2  Anesthesia Plan: General   Post-op Pain Management:    Induction: Intravenous  PONV Risk Score and Plan: Propofol  infusion and TIVA  Airway Management Planned: Natural Airway and Nasal Cannula  Additional Equipment:   Intra-op Plan:   Post-operative Plan:   Informed Consent: I have reviewed the patients History and Physical, chart, labs and discussed the procedure including the risks, benefits and alternatives for the proposed anesthesia with the patient or authorized representative who has indicated his/her understanding and  acceptance.     Dental Advisory Given  Plan Discussed with: Anesthesiologist, CRNA and Surgeon  Anesthesia Plan Comments: (Patient consented for risks of anesthesia including but not limited to:  - adverse reactions to medications - risk of airway placement if required - damage to eyes, teeth, lips or other oral mucosa - nerve damage due to positioning  - sore throat or hoarseness - Damage to heart, brain, nerves, lungs, other parts of body or loss of life  Patient voiced understanding and assent.)        Anesthesia Quick Evaluation

## 2024-07-02 NOTE — H&P (Signed)
 Pre-Procedure H&P   Patient ID: Karen Harrington is a 42 y.o. female.  Gastroenterology Provider: Elspeth Ozell Jungling, DO  Referring Provider: Jonette Primmer, PA PCP: Donzella Lauraine SAILOR, DO  Date: 07/02/2024  HPI Ms. Karen Harrington is a 43 y.o. female who presents today for Esophagogastroduodenoscopy and Colonoscopy for Chronic diarrhea, chronic bilateral lower abdominal pain, fecal urgency, hematemesis with nausea, hematochezia . Patient's spouse is present on facetime on phone during evaluation.  Patient with multiple years of abdominal pain, diarrhea nausea, vomiting.  She has noted worsening over the last year specifically postprandially.  Her abdominal cramping improves with bowel movement.  More recently she has noted blood in her stool. CT in 2024 was negative.  Fecal Cal elevated at 418 sed rate 29 CRP 17.  Infectious workup negative.  Ferritin 32 sat 21 creatinine 0.52  Past Medical History:  Diagnosis Date   Allergy    Anemia    Anxiety    Depression    DJD (degenerative joint disease)    GERD (gastroesophageal reflux disease)     Past Surgical History:  Procedure Laterality Date   LITHOTRIPSY      Family History No h/o GI disease or malignancy  Review of Systems  Constitutional:  Negative for activity change, appetite change, chills, diaphoresis, fatigue, fever and unexpected weight change.  HENT:  Negative for trouble swallowing and voice change.   Respiratory:  Negative for shortness of breath and wheezing.   Cardiovascular:  Negative for chest pain, palpitations and leg swelling.  Gastrointestinal:  Positive for abdominal pain, blood in stool, diarrhea, nausea and vomiting. Negative for abdominal distention, anal bleeding, constipation and rectal pain.  Musculoskeletal:  Negative for arthralgias and myalgias.  Skin:  Negative for color change and pallor.  Neurological:  Negative for dizziness, syncope and weakness.  Psychiatric/Behavioral:  Negative for confusion.    All other systems reviewed and are negative.    Medications No current facility-administered medications on file prior to encounter.   Current Outpatient Medications on File Prior to Encounter  Medication Sig Dispense Refill   cyanocobalamin  (VITAMIN B12) 1000 MCG tablet Take 1 tablet (1,000 mcg total) by mouth daily. 90 tablet 1   gabapentin  (NEURONTIN ) 300 MG capsule Take 1 capsule (300 mg total) by mouth at bedtime as needed and may repeat dose one time if needed. 60 capsule 0   meloxicam  (MOBIC ) 15 MG tablet Take 1 tablet (15 mg total) by mouth daily. 60 tablet 0   propranolol  ER (INDERAL  LA) 60 MG 24 hr capsule Take 1 capsule (60 mg total) by mouth daily. 30 capsule 1   colesevelam  (WELCHOL ) 625 MG tablet Take 1 tablet (625 mg total) by mouth 2 (two) times daily with a meal. 60 tablet 1   promethazine -dextromethorphan (PROMETHAZINE -DM) 6.25-15 MG/5ML syrup Take 5 mLs by mouth 4 (four) times daily as needed. 118 mL 0   rizatriptan  (MAXALT -MLT) 10 MG disintegrating tablet Take 1 tablet (10 mg total) by mouth as needed for migraine. May repeat in 2 hours if needed 10 tablet 0   traZODone  (DESYREL ) 50 MG tablet Take 1-1.5 tablets (50-75 mg total) by mouth at bedtime. 135 tablet 1   Vitamin D , Ergocalciferol , (DRISDOL ) 1.25 MG (50000 UNIT) CAPS capsule Take 1 capsule (50,000 Units total) by mouth every 7 (seven) days. 12 capsule 1    Pertinent medications related to GI and procedure were reviewed by me with the patient prior to the procedure   Current Facility-Administered Medications:  0.9 %  sodium chloride  infusion, , Intravenous, Continuous, Onita Elspeth Sharper, DO, Last Rate: 20 mL/hr at 07/02/24 1110, New Bag at 07/02/24 1110  sodium chloride  20 mL/hr at 07/02/24 1110       Allergies  Allergen Reactions   Other Other (See Comments)    Eggs   Allergies were reviewed by me prior to the procedure  Objective   Body mass index is 38.87 kg/m. Vitals:   07/02/24 1047   BP: (!) 144/90  Pulse: 91  Resp: 18  Temp: (!) 97.4 F (36.3 C)  TempSrc: Temporal  SpO2: 94%  Weight: 112.6 kg  Height: 5' 7 (1.702 m)     Physical Exam Vitals and nursing note reviewed.  Constitutional:      General: She is not in acute distress.    Appearance: Normal appearance. She is obese. She is not ill-appearing, toxic-appearing or diaphoretic.  HENT:     Head: Normocephalic and atraumatic.     Nose: Nose normal.     Mouth/Throat:     Mouth: Mucous membranes are moist.     Pharynx: Oropharynx is clear.  Eyes:     General: No scleral icterus.    Extraocular Movements: Extraocular movements intact.  Cardiovascular:     Rate and Rhythm: Normal rate and regular rhythm.     Heart sounds: Normal heart sounds. No murmur heard.    No friction rub. No gallop.  Pulmonary:     Effort: Pulmonary effort is normal. No respiratory distress.     Breath sounds: Normal breath sounds. No wheezing, rhonchi or rales.  Abdominal:     General: Bowel sounds are normal. There is no distension.     Palpations: Abdomen is soft.     Tenderness: There is no abdominal tenderness. There is no guarding or rebound.  Musculoskeletal:     Cervical back: Neck supple.     Right lower leg: No edema.     Left lower leg: No edema.  Skin:    General: Skin is warm and dry.     Coloration: Skin is not jaundiced or pale.  Neurological:     General: No focal deficit present.     Mental Status: She is alert and oriented to person, place, and time. Mental status is at baseline.  Psychiatric:        Mood and Affect: Mood normal.        Behavior: Behavior normal.        Thought Content: Thought content normal.        Judgment: Judgment normal.      Assessment:  Ms. Karen Harrington is a 43 y.o. female  who presents today for Esophagogastroduodenoscopy and Colonoscopy for Chronic diarrhea, chronic bilateral lower abdominal pain, fecal urgency, hematemesis with nausea, hematochezia .  Plan:   Esophagogastroduodenoscopy and Colonoscopy with possible intervention today  Esophagogastroduodenoscopy and Colonoscopy with possible biopsy, control of bleeding, polypectomy, and interventions as necessary has been discussed with the patient/patient representative. Informed consent was obtained from the patient/patient representative after explaining the indication, nature, and risks of the procedure including but not limited to death, bleeding, perforation, missed neoplasm/lesions, cardiorespiratory compromise, and reaction to medications. Opportunity for questions was given and appropriate answers were provided. Patient/patient representative has verbalized understanding is amenable to undergoing the procedure.   Elspeth Sharper Onita, DO  [facility]  Portions of the record may have been created with voice recognition software. Occasional wrong-word or 'sound-a-like' substitutions may have occurred due to  the inherent limitations of voice recognition software.  Read the chart carefully and recognize, using context, where substitutions may have occurred.

## 2024-07-02 NOTE — Anesthesia Postprocedure Evaluation (Signed)
 Anesthesia Post Note  Patient: Karen Harrington  Procedure(s) Performed: COLONOSCOPY EGD (ESOPHAGOGASTRODUODENOSCOPY)  Patient location during evaluation: Endoscopy Anesthesia Type: General Level of consciousness: awake and alert Pain management: pain level controlled Vital Signs Assessment: post-procedure vital signs reviewed and stable Respiratory status: spontaneous breathing, nonlabored ventilation and respiratory function stable Cardiovascular status: blood pressure returned to baseline and stable Postop Assessment: no apparent nausea or vomiting Anesthetic complications: no   No notable events documented.   Last Vitals:  Vitals:   07/02/24 1223 07/02/24 1233  BP: 120/89 102/89  Pulse: 92   Resp:    Temp: 37 C   SpO2: 100%     Last Pain:  Vitals:   07/02/24 1243  TempSrc:   PainSc: 0-No pain                 Fairy POUR Zana Biancardi

## 2024-07-02 NOTE — Transfer of Care (Signed)
 Immediate Anesthesia Transfer of Care Note  Patient: Karen Harrington  Procedure(s) Performed: COLONOSCOPY EGD (ESOPHAGOGASTRODUODENOSCOPY)  Patient Location: Endoscopy Unit  Anesthesia Type:MAC  Level of Consciousness: awake and alert   Airway & Oxygen Therapy: Patient Spontanous Breathing and Patient connected to face mask oxygen  Post-op Assessment: Report given to RN and Post -op Vital signs reviewed and stable  Post vital signs: Reviewed and stable  Last Vitals:  Vitals Value Taken Time  BP 120/89 07/02/24 12:23  Temp    Pulse 93 07/02/24 12:23  Resp 23 07/02/24 12:23  SpO2 100 % 07/02/24 12:23  Vitals shown include unfiled device data.  Last Pain:  Vitals:   07/02/24 1047  TempSrc: Temporal         Complications: No notable events documented.

## 2024-07-02 NOTE — Op Note (Signed)
 [facility] Gastroenterology Patient Name: Karen Harrington Procedure Date: 07/02/2024 10:56 AM MRN: [id number] Account #: [id number] Date of Birth: 04/26/1981 Admit Type: Outpatient Age: 43 Room: [facility] ENDO ROOM 1 Gender: Female Note Status: Finalized Instrument Name: Upper GI Scope [id number] Procedure:             Upper GI endoscopy Indications:           hematemesis with nausea Providers:             Elspeth Ozell Jungling DO, DO Medicines:             Monitored Anesthesia Care Complications:         No immediate complications. Estimated blood loss:                         Minimal. Procedure:             Pre-Anesthesia Assessment:                        - Prior to the procedure, a History and Physical was                         performed, and patient medications and allergies were                         reviewed. The patient is competent. The risks and                         benefits of the procedure and the sedation options and                         risks were discussed with the patient. All questions                         were answered and informed consent was obtained.                         Patient identification and proposed procedure were                         verified by the physician, the nurse, the anesthetist                         and the technician in the endoscopy suite. Mental                         Status Examination: alert and oriented. Airway                         Examination: normal oropharyngeal airway and neck                         mobility. Respiratory Examination: clear to                         auscultation. CV Examination: RRR, no murmurs, no S3                         or S4. Prophylactic Antibiotics: The patient  does not                         require prophylactic antibiotics. Prior                         Anticoagulants: The patient has taken no anticoagulant                         or antiplatelet agents. ASA Grade Assessment:  II - A                         patient with mild systemic disease. After reviewing                         the risks and benefits, the patient was deemed in                         satisfactory condition to undergo the procedure. The                         anesthesia plan was to use monitored anesthesia care                         (MAC). Immediately prior to administration of                         medications, the patient was re-assessed for adequacy                         to receive sedatives. The heart rate, respiratory                         rate, oxygen saturations, blood pressure, adequacy of                         pulmonary ventilation, and response to care were                         monitored throughout the procedure. The physical                         status of the patient was re-assessed after the                         procedure.                        After obtaining informed consent, the endoscope was                         passed under direct vision. Throughout the procedure,                         the patient's blood pressure, pulse, and oxygen                         saturations were monitored continuously. The Endoscope  was introduced through the mouth, and advanced to the                         third part of duodenum. The upper GI endoscopy was                         accomplished without difficulty. The patient tolerated                         the procedure well. Findings:      The duodenal bulb, first portion of the duodenum and second portion of       the duodenum were normal. Estimated blood loss was minimal.      Segmental moderate inflammation characterized by erosions, erythema,       friability and granularity was found in the gastric fundus and in the       gastric antrum. Biopsies were taken with a cold forceps for histology.       Estimated blood loss was minimal.      A single 8 to 10 mm semi-sessile polyp with no  bleeding and no stigmata       of recent bleeding was found at the incisura. Biopsies were taken with a       cold forceps for histology. Estimated blood loss was minimal.      The exam of the stomach was otherwise normal.      The Z-line was regular. Estimated blood loss: none.      Esophagogastric landmarks were identified: the lower esophageal       sphincter was found at 39 cm from the incisors.      The examined esophagus was normal. Estimated blood loss: none. Impression:            - Normal duodenal bulb, first portion of the duodenum                         and second portion of the duodenum.                        - Gastritis. Biopsied.                        - A single gastric polyp. Biopsied.                        - Z-line regular.                        - Esophagogastric landmarks identified.                        - Normal esophagus. Recommendation:        - Patient has a contact number available for                         emergencies. The signs and symptoms of potential                         delayed complications were discussed with the patient.                         Return to  normal activities tomorrow. Written                         discharge instructions were provided to the patient.                        - Discharge patient to home.                        - Resume previous diet.                        - Continue present medications.                        - Stop non steroidal anti inflammatories such as                         mobic /meloxicam                         Increase proton pump inhibitor to twice a day                        - No ibuprofen , naproxen , or other non-steroidal                         anti-inflammatory drugs.                        - Await pathology results.                        - Return to GI clinic as previously scheduled.                        - The findings and recommendations were discussed with                         the  patient. Procedure Code(s):     --- Professional ---                        [id number], Esophagogastroduodenoscopy, flexible,                         transoral; with biopsy, single or multiple Diagnosis Code(s):     --- Professional ---                        K29.70, Gastritis, unspecified, without bleeding                        K31.7, Polyp of stomach and duodenum CPT copyright 2022 American Medical Association. All rights reserved. The codes documented in this report are preliminary and upon coder review may  be revised to meet current compliance requirements. Attending Participation:      I personally performed the entire procedure. Elspeth Jungling, DO Elspeth Ozell Jungling DO, DO 07/02/2024 12:22:49 PM This report has been signed electronically. Number of Addenda: 0 Note Initiated On: 07/02/2024 10:56 AM Estimated Blood Loss:  Estimated blood loss was minimal.      [facility]

## 2024-07-06 LAB — SURGICAL PATHOLOGY

## 2024-07-19 DIAGNOSIS — K580 Irritable bowel syndrome with diarrhea: Secondary | ICD-10-CM | POA: Diagnosis not present

## 2024-07-19 DIAGNOSIS — B9681 Helicobacter pylori [H. pylori] as the cause of diseases classified elsewhere: Secondary | ICD-10-CM | POA: Diagnosis not present

## 2024-07-19 DIAGNOSIS — K2970 Gastritis, unspecified, without bleeding: Secondary | ICD-10-CM | POA: Diagnosis not present

## 2024-07-19 DIAGNOSIS — R195 Other fecal abnormalities: Secondary | ICD-10-CM | POA: Diagnosis not present

## 2024-08-30 ENCOUNTER — Ambulatory Visit
Admission: RE | Admit: 2024-08-30 | Discharge: 2024-08-30 | Disposition: A | Discharge status: Home / Self Care | Attending: Family Medicine | Admitting: Family Medicine

## 2024-08-30 ENCOUNTER — Encounter: Payer: Self-pay | Admitting: Family Medicine

## 2024-08-30 ENCOUNTER — Ambulatory Visit (INDEPENDENT_AMBULATORY_CARE_PROVIDER_SITE_OTHER): Admitting: Family Medicine

## 2024-08-30 ENCOUNTER — Ambulatory Visit
Admission: RE | Admit: 2024-08-30 | Discharge: 2024-08-30 | Disposition: A | Discharge status: Home / Self Care | Source: Ambulatory Visit | Attending: Family Medicine | Admitting: Family Medicine

## 2024-08-30 VITALS — BP 113/66 | HR 88 | Temp 98.1°F | Ht 67.0 in | Wt 258.5 lb

## 2024-08-30 DIAGNOSIS — Z713 Dietary counseling and surveillance: Secondary | ICD-10-CM

## 2024-08-30 DIAGNOSIS — G8929 Other chronic pain: Secondary | ICD-10-CM

## 2024-08-30 DIAGNOSIS — G2581 Restless legs syndrome: Secondary | ICD-10-CM

## 2024-08-30 DIAGNOSIS — M25512 Pain in left shoulder: Secondary | ICD-10-CM

## 2024-08-30 DIAGNOSIS — R0683 Snoring: Secondary | ICD-10-CM | POA: Diagnosis not present

## 2024-08-30 DIAGNOSIS — Z6841 Body Mass Index (BMI) 40.0 and over, adult: Secondary | ICD-10-CM

## 2024-08-30 DIAGNOSIS — E559 Vitamin D deficiency, unspecified: Secondary | ICD-10-CM

## 2024-08-30 DIAGNOSIS — G4730 Sleep apnea, unspecified: Secondary | ICD-10-CM | POA: Diagnosis not present

## 2024-08-30 DIAGNOSIS — E538 Deficiency of other specified B group vitamins: Secondary | ICD-10-CM

## 2024-08-30 DIAGNOSIS — R202 Paresthesia of skin: Secondary | ICD-10-CM

## 2024-08-30 DIAGNOSIS — G479 Sleep disorder, unspecified: Secondary | ICD-10-CM | POA: Diagnosis not present

## 2024-08-30 DIAGNOSIS — G4700 Insomnia, unspecified: Secondary | ICD-10-CM

## 2024-08-30 MED ORDER — TRAZODONE HCL 100 MG PO TABS: 100.0000 mg | ORAL_TABLET | Freq: Every day | ORAL | 1 refills | Status: DC

## 2024-08-30 MED ORDER — GABAPENTIN 600 MG PO TABS: 600.0000 mg | ORAL_TABLET | Freq: Every evening | ORAL | 1 refills | Status: DC | PRN

## 2024-08-30 MED ORDER — ZEPBOUND 2.5 MG/0.5ML ~~LOC~~ SOAJ: 2.5000 mg | SUBCUTANEOUS | 0 refills | Status: DC

## 2024-08-30 NOTE — Progress Notes (Signed)
 Established patient visit   Patient: Karen Harrington   DOB: [birth year]   43 y.o. Female  MRN: [id number] Visit Date: 08/30/2024  Today's healthcare provider: LAURAINE LOISE BUOY, DO   Chief Complaint  Patient presents with   Follow-up    Patient is here today to due to her left shoulder having pain, states it has been hurting since her last visit just seems like it is hurting more.  Reports she hasn't done anything injure the shoulder.  Reaching, lifting, and stretching makes it hurt worse.  Been using a heating pad to try to get some relief but not getting any. Also wants to see if provider will put her on Mounjaro.   Subjective    HPI Karen Harrington is a 43 year old female who presents with worsening left shoulder pain.  She experiences worsening left shoulder pain, which has progressively impacted her ability to perform daily activities, including lifting and reaching. As a production designer, theatre/television/film, she finds it difficult to lift animals, even those weighing as little as 20 pounds. The pain is exacerbated by pressure, such as when getting up from a chair, and is present during sleep, particularly as she is a stomach sleeper. She uses a heating pad for relief and takes Tylenol  for pain management, as she cannot take NSAIDs due to gastrointestinal issues. She notes the pain is also worse when she tries to twist open the lid on a jar.  She has a recent diagnosis of H. pylori infection and is currently on a quadruple antibiotic regimen, which she started a few days ago (delayed due to financial constraints). She endorses she is taking the antibiotics as prescribed. She cannot take meloxicam  due to its NSAID properties, which she was advised by the gastroenterologist are contraindicated given her gastrointestinal history.  She experiences migraines, which propranolol  did not alleviate, leading her to discontinue it. She continues to experience migraines with a similar frequency, though they may have  slightly decreased. She takes gabapentin  and trazodone  at night to aid sleep, using two doses of each medication.  She reports loud snoring, as noted by her wife, but has not yet completed a sleep study despite a previous order for one. She has not been contacted to schedule it.  She mentions a history of prediabetes noted in past blood work. She is interested in weight loss options, having tried various diets and exercise regimens without success. She reports that she would like to reduce her weight to under 200 pounds.      Medications: Outpatient Medications Prior to Visit  Medication Sig   cyanocobalamin  (VITAMIN B12) 1000 MCG tablet Take 1 tablet (1,000 mcg total) by mouth daily.   promethazine -dextromethorphan (PROMETHAZINE -DM) 6.25-15 MG/5ML syrup Take 5 mLs by mouth 4 (four) times daily as needed.   Vitamin D , Ergocalciferol , (DRISDOL ) 1.25 MG (50000 UNIT) CAPS capsule Take 1 capsule (50,000 Units total) by mouth every 7 (seven) days.   [DISCONTINUED] colesevelam  (WELCHOL ) 625 MG tablet Take 1 tablet (625 mg total) by mouth 2 (two) times daily with a meal.   [DISCONTINUED] gabapentin  (NEURONTIN ) 300 MG capsule Take 1 capsule (300 mg total) by mouth at bedtime as needed and may repeat dose one time if needed.   [DISCONTINUED] propranolol  ER (INDERAL  LA) 60 MG 24 hr capsule Take 1 capsule (60 mg total) by mouth daily.   [DISCONTINUED] traZODone  (DESYREL ) 50 MG tablet Take 1-1.5 tablets (50-75 mg total) by mouth at bedtime.   [DISCONTINUED] meloxicam  (  MOBIC ) 15 MG tablet Take 1 tablet (15 mg total) by mouth daily.   [DISCONTINUED] rizatriptan  (MAXALT -MLT) 10 MG disintegrating tablet Take 1 tablet (10 mg total) by mouth as needed for migraine. May repeat in 2 hours if needed   No facility-administered medications prior to visit.        Objective    BP 113/66 (BP Location: Right Arm, Patient Position: Sitting, Cuff Size: Large)   Pulse 88   Temp 98.1 F (36.7 C) (Oral)   Ht 5' 7  (1.702 m)   Wt 258 lb 8 oz (117.3 kg)   SpO2 98%   BMI 40.49 kg/m     Physical Exam Vitals and nursing note reviewed.  Constitutional:      General: She is not in acute distress.    Appearance: Normal appearance.  HENT:     Head: Normocephalic and atraumatic.  Eyes:     General: No scleral icterus.    Conjunctiva/sclera: Conjunctivae normal.  Cardiovascular:     Rate and Rhythm: Normal rate.  Pulmonary:     Effort: Pulmonary effort is normal.  Neurological:     Mental Status: She is alert and oriented to person, place, and time. Mental status is at baseline.  Psychiatric:        Mood and Affect: Mood normal.        Behavior: Behavior normal.      No results found for any visits on 08/30/24.  Assessment & Plan    Chronic left shoulder pain -     Ambulatory referral to Orthopedics -     DG Shoulder Left; Future  Loud snoring -     Ambulatory referral to Sleep Studies  Restless sleeper -     Ambulatory referral to Sleep Studies  Observed sleep apnea -     Ambulatory referral to Sleep Studies  Insomnia, unspecified type -     Gabapentin ; Take 1 tablet (600 mg total) by mouth at bedtime as needed.  Dispense: 90 tablet; Refill: 1 -     traZODone  HCl; Take 1 tablet (100 mg total) by mouth at bedtime.  Dispense: 90 tablet; Refill: 1  Restless legs  Vitamin D  deficiency -     VITAMIN D  25 Hydroxy (Vit-D Deficiency, Fractures)  Vitamin B12 deficiency -     Vitamin B12  Morbid obesity with BMI of 40.0-44.9, adult (HCC) -     Zepbound; Inject 2.5 mg into the skin once a week.  Dispense: 2 mL; Refill: 0  Weight loss counseling, encounter for -     Zepbound; Inject 2.5 mg into the skin once a week.  Dispense: 2 mL; Refill: 0       Chronic left shoulder pain Chronic pain with significantly limited movement.  Suspected rotator cuff tear.  Will order x-ray for evaluation today and refer to orthopedics for further management. - Ordered left shoulder x-ray. -  Referred to orthopedics. - Provided work note for lifting and reaching restrictions.  Loud snoring; restless sleeper; observed sleep apnea Suspected sleep apnea with loud snoring, restless sleep and observed periods of apnea.  - Sent new order for sleep study. - Provided contact information for scheduling ([facility]).  Insomnia, unspecified; restless legs Chronic insomnia managed with trazodone  and gabapentin . Shoulder pain affects sleep position. - Increase gabapentin  to 600 mg nightly as needed for sleep and restless legs. - Increase trazodone  to 100 mg nightly for sleep.  Vitamin D  deficiency On supplementation. Plan to recheck levels post-supplementation. - Check  vitamin D  levels today.  Vitamin B12 deficiency Taking oral B12 supplements. No injections. - Check B12 levels today.  Morbid obesity with BMI of 40.0-44.9; weight loss counseling, encounter for Discussed weight management options. Insurance may affect medication coverage. Encouraged physical activity and caloric deficit. - Encouraged 150 minutes of moderate exercise weekly. - Advised caloric deficit for weight loss. - Discussed weight loss medications pending insurance approval.  Prescribed Zepbound as noted.  General Health Maintenance - Provided information on scheduling mammogram.  Patient to call and schedule.     Return in about 3 months (around 11/30/2024) for Weight, Chronic f/u.      I discussed the assessment and treatment plan with the patient  The patient was provided an opportunity to ask questions and all were answered. The patient agreed with the plan and demonstrated an understanding of the instructions.   The patient was advised to call back or seek an in-person evaluation if the symptoms worsen or if the condition fails to improve as anticipated.    LAURAINE LOISE BUOY, DO  [facility] [phone] (phone) [phone] (fax)  [facility]

## 2024-08-30 NOTE — Patient Instructions (Addendum)
 Please call the [facility] [phone]) to schedule a routine screening mammogram.   [facility] [email or web address] You may also register by phone at [phone].

## 2024-09-02 ENCOUNTER — Telehealth: Payer: Self-pay

## 2024-09-02 ENCOUNTER — Other Ambulatory Visit (HOSPITAL_COMMUNITY): Payer: Self-pay

## 2024-09-02 NOTE — Telephone Encounter (Signed)
 Pharmacy Patient Advocate Encounter   Received notification from Onbase that prior authorization for Zepbound 2.5 mg/0.5 ml auto injectors is required/requested.   Insurance verification completed.   The patient is insured through [facility].   Per test claim: In order to submit a prior auth for OSA a sleep study is required. I did not see one on patient's chart.

## 2024-09-06 ENCOUNTER — Ambulatory Visit: Payer: Self-pay | Admitting: Family Medicine

## 2024-09-06 ENCOUNTER — Other Ambulatory Visit (HOSPITAL_COMMUNITY): Payer: Self-pay

## 2024-09-06 DIAGNOSIS — G4733 Obstructive sleep apnea (adult) (pediatric): Secondary | ICD-10-CM

## 2024-09-06 DIAGNOSIS — Z713 Dietary counseling and surveillance: Secondary | ICD-10-CM

## 2024-09-09 DIAGNOSIS — K259 Gastric ulcer, unspecified as acute or chronic, without hemorrhage or perforation: Secondary | ICD-10-CM | POA: Diagnosis not present

## 2024-09-09 DIAGNOSIS — M25512 Pain in left shoulder: Secondary | ICD-10-CM | POA: Diagnosis not present

## 2024-09-09 DIAGNOSIS — M62512 Muscle wasting and atrophy, not elsewhere classified, left shoulder: Secondary | ICD-10-CM | POA: Diagnosis not present

## 2024-09-10 ENCOUNTER — Telehealth: Payer: Self-pay

## 2024-09-10 NOTE — Telephone Encounter (Signed)
 Called patient to get her scheduled to [facility]. I did leave a VM requesting that the patient call us  back to get that appointment scheduled in [place].

## 2024-09-13 ENCOUNTER — Other Ambulatory Visit (HOSPITAL_COMMUNITY): Payer: Self-pay

## 2024-09-13 ENCOUNTER — Telehealth: Payer: Self-pay | Admitting: Pharmacy Technician

## 2024-09-13 NOTE — Telephone Encounter (Signed)
 Pharmacy Patient Advocate Encounter   Received notification from Patient Advice Request messages that prior authorization for Zepbound 2.5MG /0.5ML pen-injectors is required/requested.   Insurance verification completed.   The patient is insured through [id number].   Per test claim: PA required; PA submitted to above mentioned insurance via Latent Key/confirmation #/[id number] [facility] Status is pending

## 2024-09-13 NOTE — Telephone Encounter (Signed)
 PA request has been Started. New Encounter has been or will be created for follow up. For additional info see Pharmacy Prior Auth telephone encounter from 09/13/2024.

## 2024-09-24 ENCOUNTER — Ambulatory Visit
Admission: RE | Admit: 2024-09-24 | Discharge: 2024-09-24 | Disposition: A | Discharge status: Home / Self Care | Attending: Family Medicine | Admitting: Family Medicine

## 2024-09-24 VITALS — BP 135/85 | HR 92 | Temp 99.4°F | Resp 20

## 2024-09-24 DIAGNOSIS — J029 Acute pharyngitis, unspecified: Secondary | ICD-10-CM

## 2024-09-24 DIAGNOSIS — J069 Acute upper respiratory infection, unspecified: Secondary | ICD-10-CM

## 2024-09-24 DIAGNOSIS — R051 Acute cough: Secondary | ICD-10-CM | POA: Diagnosis not present

## 2024-09-24 LAB — POCT RAPID STREP A (OFFICE): Rapid Strep A Screen: NEGATIVE

## 2024-09-24 LAB — POCT INFLUENZA A/B
Influenza A, POC: NEGATIVE
Influenza B, POC: NEGATIVE

## 2024-09-24 LAB — POC SOFIA SARS ANTIGEN FIA: SARS Coronavirus 2 Ag: NEGATIVE

## 2024-09-24 MED ORDER — PROMETHAZINE-DM 6.25-15 MG/5ML PO SYRP: 5.0000 mL | ORAL_SOLUTION | Freq: Four times a day (QID) | ORAL | 0 refills | Status: DC | PRN

## 2024-09-24 NOTE — ED Provider Notes (Signed)
 [facility]    CSN: [id number] Arrival date & time: 09/24/24  1531      History   Chief Complaint Chief Complaint  Patient presents with   Sore Throat    Congestion coughing stuffy nose can't smell - Entered by patient    HPI Karen Harrington is a 43 y.o. female  presents for evaluation of URI symptoms for 2 days. Patient reports associated symptoms of cough, ST, fatigue, HA. Denies N/V/D, fevers, ear pain, shortness of breath. Patient does not have a hx of asthma. Patient does vape.  Reports wife had similar symptoms.  Pt has taken cold medicine OTC for symptoms. Pt has no other concerns at this time.    Sore Throat Associated symptoms include headaches.    Past Medical History:  Diagnosis Date   Allergy    Anemia    Anxiety    Depression    DJD (degenerative joint disease)    GERD (gastroesophageal reflux disease)     Patient Active Problem List   Diagnosis Date Noted   Establishing care with new doctor, encounter for 02/21/2024   Attention deficit 02/21/2024   Nicotine dependence with current use 02/21/2024   Elevated blood-pressure reading without diagnosis of hypertension 02/21/2024   Chronic diarrhea 02/21/2024   Acute pain of both shoulders 02/21/2024   Chronic bilateral low back pain without sciatica 02/21/2024   Restless legs 02/21/2024   Chronic fatigue 02/21/2024   Chronic migraine without aura without status migrainosus, not intractable 02/21/2024    Past Surgical History:  Procedure Laterality Date   COLONOSCOPY N/A 07/02/2024   Procedure: COLONOSCOPY;  Surgeon: Onita Elspeth Sharper, DO;  Location: [facility] ENDOSCOPY;  Service: Gastroenterology;  Laterality: N/A;   ESOPHAGOGASTRODUODENOSCOPY N/A 07/02/2024   Procedure: EGD (ESOPHAGOGASTRODUODENOSCOPY);  Surgeon: Onita Elspeth Sharper, DO;  Location: [facility] ENDOSCOPY;  Service: Gastroenterology;  Laterality: N/A;   LITHOTRIPSY      OB History   No obstetric history on file.      Home  Medications    Prior to Admission medications   Medication Sig Start Date End Date Taking? Authorizing Provider  promethazine -dextromethorphan (PROMETHAZINE -DM) 6.25-15 MG/5ML syrup Take 5 mLs by mouth 4 (four) times daily as needed for cough. 09/24/24  Yes Almeter Westhoff, Jodi R, NP  cyanocobalamin  (VITAMIN B12) 1000 MCG tablet Take 1 tablet (1,000 mcg total) by mouth daily. 04/22/24   Donzella Lauraine SAILOR, DO  gabapentin  (NEURONTIN ) 600 MG tablet Take 1 tablet (600 mg total) by mouth at bedtime as needed. 08/30/24   Donzella Lauraine SAILOR, DO  tirzepatide  (ZEPBOUND ) 2.5 MG/0.5ML Pen Inject 2.5 mg into the skin once a week. 08/30/24   Donzella Lauraine SAILOR, DO  traZODone  (DESYREL ) 100 MG tablet Take 1 tablet (100 mg total) by mouth at bedtime. 08/30/24   Pardue, Lauraine SAILOR, DO  Vitamin D , Ergocalciferol , (DRISDOL ) 1.25 MG (50000 UNIT) CAPS capsule Take 1 capsule (50,000 Units total) by mouth every 7 (seven) days. 02/21/24   Donzella Lauraine SAILOR, DO    Family History Family History  Problem Relation Age of Onset   COPD Mother    Heart failure Mother     Social History Social History   Tobacco Use   Smoking status: Former    Current packs/day: 0.00    Average packs/day: 1 pack/day for 5.0 years (5.0 ttl pk-yrs)    Types: Cigarettes    Start date: 10/28/2004    Quit date: 10/29/2023    Years since quitting: 0.9   Smokeless tobacco: Never  Vaping Use   Vaping status: Every Day  Substance Use Topics   Alcohol use: Yes    Comment: 1 drink a year   Drug use: Never     Allergies   Other   Review of Systems Review of Systems  Constitutional:  Positive for fatigue.  HENT:  Positive for congestion and sore throat.   Respiratory:  Positive for cough.   Neurological:  Positive for headaches.     Physical Exam Triage Vital Signs ED Triage Vitals  Encounter Vitals Group     BP 09/24/24 1541 135/85     Girls Systolic BP Percentile --      Girls Diastolic BP Percentile --      Boys Systolic BP Percentile --       Boys Diastolic BP Percentile --      Pulse Rate 09/24/24 1541 92     Resp 09/24/24 1541 20     Temp 09/24/24 1541 99.4 F (37.4 C)     Temp Source 09/24/24 1541 Oral     SpO2 09/24/24 1541 92 %     Weight --      Height --      Head Circumference --      Peak Flow --      Pain Score 09/24/24 1544 7     Pain Loc --      Pain Education --      Exclude from Growth Chart --    No data found.  Updated Vital Signs BP 135/85 (BP Location: Right Arm)   Pulse 92   Temp 99.4 F (37.4 C) (Oral)   Resp 20   SpO2 92%   Visual Acuity Right Eye Distance:   Left Eye Distance:   Bilateral Distance:    Right Eye Near:   Left Eye Near:    Bilateral Near:     Physical Exam Vitals and nursing note reviewed.  Constitutional:      General: She is not in acute distress.    Appearance: She is well-developed. She is not ill-appearing.  HENT:     Head: Normocephalic and atraumatic.     Right Ear: Tympanic membrane and ear canal normal.     Left Ear: Tympanic membrane and ear canal normal.     Nose: Congestion present.     Mouth/Throat:     Mouth: Mucous membranes are moist.     Pharynx: Oropharynx is clear. Uvula midline. Posterior oropharyngeal erythema present.     Tonsils: No tonsillar exudate or tonsillar abscesses.  Eyes:     Conjunctiva/sclera: Conjunctivae normal.     Pupils: Pupils are equal, round, and reactive to light.  Cardiovascular:     Rate and Rhythm: Normal rate and regular rhythm.     Heart sounds: Normal heart sounds.  Pulmonary:     Effort: Pulmonary effort is normal.     Breath sounds: Normal breath sounds.  Musculoskeletal:     Cervical back: Normal range of motion and neck supple.  Lymphadenopathy:     Cervical: No cervical adenopathy.  Skin:    General: Skin is warm and dry.  Neurological:     General: No focal deficit present.     Mental Status: She is alert and oriented to person, place, and time.  Psychiatric:        Mood and Affect: Mood normal.         Behavior: Behavior normal.      UC Treatments / Results  Labs (all labs  ordered are listed, but only abnormal results are displayed) Labs Reviewed  POCT INFLUENZA A/B - Normal  POCT RAPID STREP A (OFFICE) - Normal  POC SOFIA SARS ANTIGEN FIA - Normal    EKG   Radiology No results found.  Procedures Procedures (including critical care time)  Medications Ordered in UC Medications - No data to display  Initial Impression / Assessment and Plan / UC Course  I have reviewed the triage vital signs and the nursing notes.  Pertinent labs & imaging results that were available during my care of the patient were reviewed by me and considered in my medical decision making (see chart for details).  Clinical Course as of 09/24/24 1629  Fri Sep 24, 2024  1629 O2 recheck 95% on room air [JM]    Clinical Course User Index [JM] Loreda Myla SAUNDERS, NP    Reviewed exam and symptoms with patient.  No red flags.  Negative COVID flu and strep throat testing.  Discussed viral illness and symptomatic treatment.  Promethazine  DM as needed for cough, side effect profile reviewed.  Encourage rest fluids and PCP follow-up if symptoms do not improve.  ER precautions reviewed. Final Clinical Impressions(s) / UC Diagnoses   Final diagnoses:  Acute cough  Sore throat  Viral upper respiratory illness     Discharge Instructions      You tested negative for COVID, flu and strep throat.  You may take Promethazine  DM as needed for your cough.  Please note this medication make a drowsy.  Do not drink alcohol or drive.  Do not take this medication with trazodone  due to oversedation risk.  Lots of rest and fluids and please follow-up with your PCP if your symptoms do not improve.  Please go to the ER for any worsening symptoms.  I hope you feel better soon!     ED Prescriptions     Medication Sig Dispense Auth. Provider   promethazine -dextromethorphan (PROMETHAZINE -DM) 6.25-15 MG/5ML syrup Take 5  mLs by mouth 4 (four) times daily as needed for cough. 118 mL Pal Shell, Jodi R, NP      PDMP not reviewed this encounter.   Loreda Myla SAUNDERS, NP 09/24/24 [phone]

## 2024-09-24 NOTE — Discharge Instructions (Addendum)
 You tested negative for COVID, flu and strep throat.  You may take Promethazine  DM as needed for your cough.  Please note this medication make a drowsy.  Do not drink alcohol or drive.  Do not take this medication with trazodone  due to oversedation risk.  Lots of rest and fluids and please follow-up with your PCP if your symptoms do not improve.  Please go to the ER for any worsening symptoms.  I hope you feel better soon!

## 2024-09-24 NOTE — ED Triage Notes (Addendum)
 Pt reports cough, congestion, sore throat headache, loss of smell and fatigue. X 2 days

## 2024-09-27 ENCOUNTER — Encounter: Payer: Self-pay | Admitting: Family Medicine

## 2024-09-27 MED ORDER — ZEPBOUND 2.5 MG/0.5ML ~~LOC~~ SOAJ
2.5000 mg | SUBCUTANEOUS | 0 refills | Status: AC
  Filled 2024-10-07: qty 2, 28d supply, fill #0

## 2024-09-28 ENCOUNTER — Other Ambulatory Visit (HOSPITAL_COMMUNITY): Payer: Self-pay

## 2024-09-28 ENCOUNTER — Telehealth: Payer: Self-pay

## 2024-09-28 NOTE — Telephone Encounter (Signed)
 Clinical questions have been answered and PA submitted. PA currently Pending.

## 2024-09-28 NOTE — Telephone Encounter (Signed)
 Pharmacy Patient Advocate Encounter   Received notification from Onbase that prior authorization for Zepbound  2.5MG /0.5ML pen-injectors  is required/requested.   Insurance verification completed.   The patient is insured through [employer] .   Per test claim: PA required; PA started via CoverMyMeds. KEY [id number] .

## 2024-09-28 NOTE — Telephone Encounter (Signed)
 Pharmacy Patient Advocate Encounter  Received notification from [facility] that Prior Authorization for  Zepbound  2.5MG /0.5ML pen-injectors has been DENIED.  Full denial letter will be uploaded to the media tab. See denial reason below.   PA #/Case ID/Reference #: [id number]

## 2024-09-28 NOTE — Telephone Encounter (Signed)
 Pharmacy Patient Advocate Encounter  Received notification from [facility] that Prior Authorization for Zepbound  2.5MG /0.5ML pen-injectors  has been DENIED.  Full denial letter will be uploaded to the media tab. See denial reason below.     PA #/Case ID/Reference #: Denial letter has been uploaded to media tab

## 2024-09-29 NOTE — Telephone Encounter (Signed)
 PA still pending in Latent. Previously denied. See telephone encounter on 09/13/24. Will check back to check status.

## 2024-09-30 ENCOUNTER — Other Ambulatory Visit: Payer: Self-pay

## 2024-09-30 DIAGNOSIS — G4733 Obstructive sleep apnea (adult) (pediatric): Secondary | ICD-10-CM

## 2024-09-30 NOTE — Telephone Encounter (Signed)
 Patient of Dr Debi that had sleep study report come in today.  Significant numbers and need someone to initiate CPAP for Dr Donzella Thanks

## 2024-09-30 NOTE — Telephone Encounter (Signed)
 This has already been ordered and faxed through [employer].

## 2024-09-30 NOTE — Telephone Encounter (Signed)
 CPAP order completed for Dr. Donzella. Please send to supply company.

## 2024-10-04 ENCOUNTER — Telehealth: Payer: Self-pay

## 2024-10-04 ENCOUNTER — Other Ambulatory Visit (HOSPITAL_COMMUNITY): Payer: Self-pay

## 2024-10-04 NOTE — Telephone Encounter (Signed)
 Sent standard written order to [facility] for patient cpap machine. Attn: Madelin

## 2024-10-05 ENCOUNTER — Other Ambulatory Visit (HOSPITAL_COMMUNITY): Payer: Self-pay

## 2024-10-05 DIAGNOSIS — M25512 Pain in left shoulder: Secondary | ICD-10-CM | POA: Diagnosis not present

## 2024-10-05 NOTE — Telephone Encounter (Signed)
 Pharmacy Patient Advocate Encounter  Received notification from [facility] [employer]  that Prior Authorization for Zepbound  2.5MG /0.5ML pen-injectors has been APPROVED from 10/05/24 to 05/17/25. Ran test claim, Copay is $0. This test claim was processed through [facility]- copay amounts may vary at other pharmacies due to pharmacy/plan contracts, or as the patient moves through the different stages of their insurance plan.   PA #/Case ID/Reference #: [id number]

## 2024-10-06 ENCOUNTER — Other Ambulatory Visit (HOSPITAL_COMMUNITY): Payer: Self-pay

## 2024-10-07 ENCOUNTER — Other Ambulatory Visit: Payer: Self-pay

## 2024-10-07 ENCOUNTER — Encounter: Payer: Self-pay | Admitting: Family Medicine

## 2024-10-07 ENCOUNTER — Other Ambulatory Visit (HOSPITAL_COMMUNITY): Payer: Self-pay

## 2024-10-07 ENCOUNTER — Other Ambulatory Visit: Payer: Self-pay | Admitting: Family Medicine

## 2024-10-07 DIAGNOSIS — G4733 Obstructive sleep apnea (adult) (pediatric): Secondary | ICD-10-CM

## 2024-10-07 DIAGNOSIS — M75112 Incomplete rotator cuff tear or rupture of left shoulder, not specified as traumatic: Secondary | ICD-10-CM | POA: Diagnosis not present

## 2024-10-14 ENCOUNTER — Inpatient Hospital Stay: Admission: RE | Admit: 2024-10-14 | Discharge: 2024-10-14 | Attending: Family Medicine | Admitting: Family Medicine

## 2024-10-14 DIAGNOSIS — Z1231 Encounter for screening mammogram for malignant neoplasm of breast: Secondary | ICD-10-CM | POA: Insufficient documentation

## 2024-10-15 ENCOUNTER — Other Ambulatory Visit: Payer: Self-pay

## 2024-10-15 ENCOUNTER — Ambulatory Visit (INDEPENDENT_AMBULATORY_CARE_PROVIDER_SITE_OTHER): Admitting: Family Medicine

## 2024-10-15 VITALS — BP 115/86 | HR 92 | Temp 98.5°F | Ht 66.0 in | Wt 256.3 lb

## 2024-10-15 DIAGNOSIS — R52 Pain, unspecified: Secondary | ICD-10-CM

## 2024-10-15 DIAGNOSIS — J029 Acute pharyngitis, unspecified: Secondary | ICD-10-CM | POA: Diagnosis not present

## 2024-10-15 DIAGNOSIS — G4733 Obstructive sleep apnea (adult) (pediatric): Secondary | ICD-10-CM

## 2024-10-15 DIAGNOSIS — Z6841 Body Mass Index (BMI) 40.0 and over, adult: Secondary | ICD-10-CM

## 2024-10-15 LAB — POC COVID19 BINAXNOW: SARS Coronavirus 2 Ag: NEGATIVE

## 2024-10-15 LAB — POCT INFLUENZA A/B
Influenza A, POC: NEGATIVE
Influenza B, POC: NEGATIVE

## 2024-10-15 LAB — POCT RAPID STREP A (OFFICE): Rapid Strep A Screen: NEGATIVE

## 2024-10-15 MED ORDER — AMOXICILLIN 500 MG PO CAPS: 500.0000 mg | ORAL_CAPSULE | Freq: Two times a day (BID) | ORAL | 0 refills | Status: AC

## 2024-10-15 MED ORDER — ZEPBOUND 5 MG/0.5ML ~~LOC~~ SOAJ
5.0000 mg | SUBCUTANEOUS | 2 refills | Status: DC
  Filled 2024-10-15 – 2024-11-06 (×8): qty 2, 28d supply, fill #0
  Filled 2024-11-28: qty 2, 28d supply, fill #1

## 2024-10-15 NOTE — Patient Instructions (Signed)
 Your symptoms today are most likely being caused by a virus and should steadily improve in time it can take up to 7 to 10 days before you truly start to see a turnaround however things will get better   COVID and flu testing are negative, does have symptoms consistent with strep and testing was negative but we are treating presumptively for strep due to your symptoms and the test being done early (potential for a false negative).  You can take Tylenol  and/or Ibuprofen  as needed for fever reduction and pain relief.   For cough: honey 1/2 to 1 teaspoon (you can dilute the honey in water or another fluid).  You can also use guaifenesin and dextromethorphan for cough.   - You can use a humidifier for chest congestion and cough.  If you don't have a humidifier, you can sit in the bathroom with the hot shower running.      For sore throat: try warm salt water gargles, cepacol lozenges, throat spray, warm tea or water with lemon/honey, popsicles or ice, or OTC cold relief medicine for throat discomfort.   For congestion:   - take a daily anti-histamine like Zyrtec (cetirizine), Claritin  (loratadine ), or Allegra (fexofenadine),   - If you do NOT have high blood pressure, you can also take an oral decongestant, such as pseudoephedrine.    - You can also use Flonase 1-2 sprays in each nostril daily.   It is important to stay hydrated: drink plenty of fluids (water, gatorade/powerade/pedialyte, juices, or teas) to keep your throat moisturized and help further relieve irritation/discomfort.  These should be sugar-free if you are diabetic.

## 2024-10-15 NOTE — Progress Notes (Signed)
 "     Established patient visit   Patient: Karen Harrington   DOB: 27-Aug-1981   43 y.o. Female  MRN: [id number] Visit Date: 10/15/2024  Today's healthcare provider: LAURAINE LOISE BUOY, DO   Chief Complaint  Patient presents with   Acute Visit    States that she woke up yesterday with a really bad sore throat, having trouble swallowing.  Stopped up, congested, can't smell anything, body aches and chills.  No fever as she is aware of right now.  Last night took Tylenol  Cold and Severe with no relief.  States she has vomited a couple of times.    Subjective    HPI Karen Harrington is a 43 year old female who presents with a sore throat and upper respiratory symptoms.  She woke up yesterday with a severe sore throat and difficulty swallowing, describing the sensation as 'swallowing an umbrella pad.' She experiences congestion, loss of smell, body chills, and has vomited a couple of times. No fever but notes chills. She has been taking Tylenol  Cold and Severe for symptom relief.  No cough but mentions drainage in the back of her throat. She reports sinus pressure and slight ear pain. No shortness of breath or chest pain. She experienced some nausea and vomiting, with a little belly pain last night.  She mentions her wife was sick last week with bronchitis or an upper respiratory infection, and she herself was diagnosed with a viral infection after Thanksgiving, which had improved. She has had strep throat in the past, over a year ago.  She reports a racing heart and body shakes while trying to sleep last night, which she describes as unusual but not persistent. She is experiencing significant fatigue and difficulty sleeping due to her symptoms.  She is currently on a 10-day course of prednisone  for shoulder pain and is on day three of the treatment. She avoids NSAIDs due to a history of ulcers. She uses a humidifier.       Medications: Show/hide medication list[1]  Review of Systems   Constitutional:  Positive for chills. Negative for fever.  HENT:  Positive for congestion, postnasal drip, rhinorrhea, sinus pressure, sore throat and trouble swallowing. Negative for ear pain and sinus pain.   Respiratory:  Positive for cough and shortness of breath.   Cardiovascular:  Negative for chest pain.  Gastrointestinal:  Positive for abdominal pain (last night, resolved now), nausea and vomiting.        Objective    BP 115/86 (BP Location: Right Arm, Patient Position: Sitting, Cuff Size: Large)   Pulse 92   Temp 98.5 F (36.9 C) (Oral)   Ht 5' 6 (1.676 m)   Wt 256 lb 4.8 oz (116.3 kg)   SpO2 96%   BMI 41.37 kg/m     Physical Exam Vitals reviewed.  Constitutional:      General: She is not in acute distress.    Appearance: Normal appearance. She is well-developed. She is not diaphoretic.  HENT:     Head: Normocephalic and atraumatic.     Right Ear: Tympanic membrane, ear canal and external ear normal.     Left Ear: Tympanic membrane, ear canal and external ear normal.     Nose: Congestion and rhinorrhea present.     Mouth/Throat:     Mouth: Mucous membranes are moist.     Pharynx: Pharyngeal swelling (symmetrical swelling of soft palate), oropharyngeal exudate and posterior oropharyngeal erythema present.     Tonsils: Tonsillar exudate  present. 2+ on the right. 2+ on the left.  Eyes:     General: No scleral icterus.    Conjunctiva/sclera: Conjunctivae normal.     Pupils: Pupils are equal, round, and reactive to light.  Cardiovascular:     Rate and Rhythm: Normal rate and regular rhythm.     Pulses: Normal pulses.     Heart sounds: Normal heart sounds. No murmur heard. Pulmonary:     Effort: Pulmonary effort is normal. No respiratory distress.     Breath sounds: Normal breath sounds. No wheezing or rales.  Musculoskeletal:     Cervical back: Neck supple.     Right lower leg: No edema.     Left lower leg: No edema.  Lymphadenopathy:     Cervical: No  cervical adenopathy.  Skin:    General: Skin is warm and dry.     Findings: No rash.  Neurological:     Mental Status: She is alert.      Results for orders placed or performed in visit on 10/15/24  POCT rapid strep A  Result Value Ref Range   Rapid Strep A Screen Negative Negative  POC COVID-19 BinaxNow  Result Value Ref Range   SARS Coronavirus 2 Ag Negative Negative  POCT Influenza A/B  Result Value Ref Range   Influenza A, POC Negative Negative   Influenza B, POC Negative Negative    Assessment & Plan    Sore throat -     POCT rapid strep A -     POC COVID-19 BinaxNow -     POCT Influenza A/B -     Amoxicillin ; Take 1 capsule (500 mg total) by mouth 2 (two) times daily for 10 days.  Dispense: 20 capsule; Refill: 0  Generalized body aches -     POCT rapid strep A -     POC COVID-19 BinaxNow -     POCT Influenza A/B -     Amoxicillin ; Take 1 capsule (500 mg total) by mouth 2 (two) times daily for 10 days.  Dispense: 20 capsule; Refill: 0  Morbid obesity with BMI of 40.0-44.9, adult (HCC) -     Zepbound ; Inject 5 mg into the skin once a week.  Dispense: 2 mL; Refill: 2  Severe obstructive sleep apnea -     Zepbound ; Inject 5 mg into the skin once a week.  Dispense: 2 mL; Refill: 2      Sore throat; generalized body aches Suspected acute bacterial pharyngitis and tonsillitis Negative strep test, but treated as strep due to swollen tonsils with exudate and early illness stage. Differential includes viral infection, possibly flu. Anosmia likely due to congestion. - Prescribed amoxicillin  for 10 days. - Advised gurgling with salt water. - Continue prednisone  (prescribed by another provider for patient's shoulder). - Use Tylenol  for pain. - Drink warm fluids. - Use humidifier.  Morbid obesity with BMI of 40.0-44.9, adult; severe obstructive sleep apnea Currently on Zepbound  2.5 mg for weight management. Experienced nausea with first dose, possibly due to  medication. - Continue Zepbound  2.5 mg. - Increase to 5 mg after four weeks if tolerated. - Monitor for nausea and adjust dosage if necessary.  General health maintenance Recent mammogram performed, awaiting results. - Await mammogram results.    Return in about 7 weeks (around 11/30/2024), or if symptoms worsen or fail to improve, for for chronic f/u, and in 5-6 months for Chronic f/u w/next provider.      I discussed the assessment and treatment  plan with the patient  The patient was provided an opportunity to ask questions and all were answered. The patient agreed with the plan and demonstrated an understanding of the instructions.   The patient was advised to call back or seek an in-person evaluation if the symptoms worsen or if the condition fails to improve as anticipated.    LAURAINE LOISE BUOY, DO  [facility] [phone] (phone) [phone] (fax)  [facility]    [1]  Outpatient Medications Prior to Visit  Medication Sig   cyanocobalamin  (VITAMIN B12) 1000 MCG tablet Take 1 tablet (1,000 mcg total) by mouth daily.   gabapentin  (NEURONTIN ) 600 MG tablet Take 1 tablet (600 mg total) by mouth at bedtime as needed.   promethazine -dextromethorphan (PROMETHAZINE -DM) 6.25-15 MG/5ML syrup Take 5 mLs by mouth 4 (four) times daily as needed for cough.   traZODone  (DESYREL ) 100 MG tablet Take 1 tablet (100 mg total) by mouth at bedtime.   Vitamin D , Ergocalciferol , (DRISDOL ) 1.25 MG (50000 UNIT) CAPS capsule Take 1 capsule (50,000 Units total) by mouth every 7 (seven) days.   [DISCONTINUED] tirzepatide  (ZEPBOUND ) 2.5 MG/0.5ML Pen Inject 2.5 mg into the skin once a week.   No facility-administered medications prior to visit.   "

## 2024-10-22 DIAGNOSIS — G4733 Obstructive sleep apnea (adult) (pediatric): Secondary | ICD-10-CM | POA: Diagnosis not present

## 2024-10-22 DIAGNOSIS — R0683 Snoring: Secondary | ICD-10-CM | POA: Diagnosis not present

## 2024-10-26 ENCOUNTER — Other Ambulatory Visit: Payer: Self-pay

## 2024-10-30 ENCOUNTER — Other Ambulatory Visit: Payer: Self-pay

## 2024-10-31 ENCOUNTER — Other Ambulatory Visit: Payer: Self-pay

## 2024-10-31 ENCOUNTER — Other Ambulatory Visit (HOSPITAL_COMMUNITY): Payer: Self-pay

## 2024-11-01 ENCOUNTER — Other Ambulatory Visit: Payer: Self-pay

## 2024-11-03 ENCOUNTER — Encounter: Payer: Self-pay | Admitting: Family Medicine

## 2024-11-03 ENCOUNTER — Telehealth: Payer: Self-pay | Admitting: Pharmacy Technician

## 2024-11-03 ENCOUNTER — Other Ambulatory Visit (HOSPITAL_COMMUNITY): Payer: Self-pay

## 2024-11-03 NOTE — Telephone Encounter (Signed)
 Pharmacy Patient Advocate Encounter   Received notification from Onbase [employer] KEY that prior authorization for Zepbound  5MG /0.5ML pen-injectors is required/requested.   Insurance verification completed.   The patient is insured through [employer].   Per test claim: PA required; PA submitted to above mentioned insurance via Latent Key/confirmation #/EOC [id number] Status is pending

## 2024-11-04 ENCOUNTER — Ambulatory Visit: Payer: Self-pay | Admitting: Family Medicine

## 2024-11-04 NOTE — Telephone Encounter (Signed)
 Please see details in separate encounter. PA is pending.

## 2024-11-06 ENCOUNTER — Other Ambulatory Visit: Payer: Self-pay

## 2024-11-08 ENCOUNTER — Other Ambulatory Visit (HOSPITAL_COMMUNITY): Payer: Self-pay

## 2024-11-08 NOTE — Telephone Encounter (Signed)
 Pharmacy Patient Advocate Encounter  Received notification from [facility] [employer] that Prior Authorization for Zepbound  5MG /0.5ML pen-injectors has been APPROVED from 11/05/2024 to 06/17/2025.   PA #/Case ID/Reference #: [id number]

## 2024-11-18 ENCOUNTER — Ambulatory Visit (INDEPENDENT_AMBULATORY_CARE_PROVIDER_SITE_OTHER)

## 2024-11-18 DIAGNOSIS — Q666 Other congenital valgus deformities of feet: Secondary | ICD-10-CM

## 2024-11-23 NOTE — Progress Notes (Signed)
 Patient presents today for pickup of custom orthotics that were casted at previous appointment.  They were given to the patient today without issue. We checked the fit of the orthotics in the shoe and trimmed as necessary.  Patient did express understanding of gradual break-in period. Orthotics were tolerated well without complication.  Prentice Ovens, DPM

## 2024-11-29 ENCOUNTER — Other Ambulatory Visit: Payer: Self-pay

## 2024-12-02 ENCOUNTER — Ambulatory Visit: Admitting: Family Medicine

## 2024-12-02 ENCOUNTER — Other Ambulatory Visit: Payer: Self-pay

## 2024-12-02 ENCOUNTER — Encounter: Payer: Self-pay | Admitting: Family Medicine

## 2024-12-02 VITALS — BP 114/78 | HR 77 | Temp 98.0°F | Ht 66.0 in | Wt 244.9 lb

## 2024-12-02 DIAGNOSIS — R41840 Attention and concentration deficit: Secondary | ICD-10-CM

## 2024-12-02 DIAGNOSIS — E538 Deficiency of other specified B group vitamins: Secondary | ICD-10-CM

## 2024-12-02 DIAGNOSIS — G2581 Restless legs syndrome: Secondary | ICD-10-CM

## 2024-12-02 DIAGNOSIS — G4700 Insomnia, unspecified: Secondary | ICD-10-CM

## 2024-12-02 DIAGNOSIS — G43709 Chronic migraine without aura, not intractable, without status migrainosus: Secondary | ICD-10-CM

## 2024-12-02 DIAGNOSIS — F17200 Nicotine dependence, unspecified, uncomplicated: Secondary | ICD-10-CM

## 2024-12-02 DIAGNOSIS — E559 Vitamin D deficiency, unspecified: Secondary | ICD-10-CM

## 2024-12-02 DIAGNOSIS — G4733 Obstructive sleep apnea (adult) (pediatric): Secondary | ICD-10-CM | POA: Insufficient documentation

## 2024-12-02 DIAGNOSIS — R7309 Other abnormal glucose: Secondary | ICD-10-CM

## 2024-12-02 DIAGNOSIS — R030 Elevated blood-pressure reading, without diagnosis of hypertension: Secondary | ICD-10-CM

## 2024-12-02 MED ORDER — ZEPBOUND 7.5 MG/0.5ML ~~LOC~~ SOAJ
7.5000 mg | SUBCUTANEOUS | 3 refills | Status: AC
  Filled 2024-12-02: qty 2, 28d supply, fill #0

## 2024-12-02 MED ORDER — TRAZODONE HCL 100 MG PO TABS: 100.0000 mg | ORAL_TABLET | Freq: Every day | ORAL | 6 refills | Status: AC

## 2024-12-02 MED ORDER — ZEPBOUND 7.5 MG/0.5ML ~~LOC~~ SOAJ: 7.5000 mg | SUBCUTANEOUS | 3 refills | Status: DC

## 2024-12-02 MED ORDER — VITAMIN D (ERGOCALCIFEROL) 1.25 MG (50000 UNIT) PO CAPS: 50000.0000 [IU] | ORAL_CAPSULE | ORAL | 3 refills | Status: AC

## 2024-12-02 MED ORDER — GABAPENTIN 600 MG PO TABS: 600.0000 mg | ORAL_TABLET | Freq: Every evening | ORAL | 6 refills | Status: AC | PRN

## 2024-12-02 NOTE — Assessment & Plan Note (Addendum)
 Wearing CPAP every night for a minimum of 4 hours. Deriving good benefit from it. - Continue CPAP therapy with goal of consistent nightly use.

## 2024-12-02 NOTE — Progress Notes (Unsigned)
 "     Established patient visit   Patient: Karen Harrington   DOB: [birth year]   44 y.o. Female  MRN: [id number] Visit Date: 12/02/2024  Today's healthcare provider: LAURAINE LOISE BUOY, DO   Chief Complaint  Patient presents with   Follow-up    Patient is here today for a 7 week follow up, states that her CPAP is going well.  Wears it throughout the night, sometimes she takes it off it is about 2 or 3.  Has noticed that she is getting more sleep since being on the cpap.  Still having some back pain and still feels very tired when she wakes up.  Insurance is not paying for a 90 day supply Gabapentin  and Trazodone  almost out but it will only pay for a 30 day supply.   Subjective    HPI Karen Harrington is a 44 year old female with sleep apnea who presents for follow-up regarding CPAP use and medication management.  She uses her CPAP machine regularly, wearing it for at least four hours each night. However, she sometimes removes it after waking up, feeling 'done with this,' and then returns to sleep for another hour or two. Despite using the CPAP, she continues to feel very tired upon waking. Her sleep duration varies from five to nine hours, with a recent instance of nine and a half hours recorded by the CPAP machine, though she questions the accuracy of this duration as actual sleep time.  She is experiencing issues with her medication refills, particularly with trazodone , which she attempted to refill but was told she had to pay out of pocket. She clarifies that she did not receive the medication twice as indicated by the pharmacy records. She is also on gabapentin , but her insurance only covers a 30-day supply instead of 90 days.  She has not been consistent with taking her vitamin D  and B12 supplements, admitting to taking only half of the prescribed amount. She has not been taking any vitamins regularly and prefers pills over shots for vitamin supplementation.  She reports improvement with  Zepbound , noting a reduction in nausea and vomiting that she experienced initially. The nausea was limited to the first week of the initial dose, which coincided with a viral illness in her household. She has lost 14 pounds since starting the medication.  Her diet is low in vegetables, and she does not consume much citrus due to stomach issues. She eats red meat and occasionally peas, but her intake of dark leafy greens is minimal.   ***  {History (Optional):23778}  Medications: Show/hide medication list[1]   {Insert previous labs (optional):23779} {See past labs  Heme  Chem  Endocrine  Serology  Results Review (optional):1}   Objective    BP 114/78 (BP Location: Right Arm, Patient Position: Sitting, Cuff Size: Large)   Pulse 77   Temp 98 F (36.7 C) (Oral)   Ht 5' 6 (1.676 m)   Wt 244 lb 14.4 oz (111.1 kg)   SpO2 98%   BMI 39.53 kg/m  {Insert last BP/Wt (optional):23777}{See vitals history (optional):1}   Physical Exam Vitals and nursing note reviewed.  Constitutional:      General: She is not in acute distress.    Appearance: Normal appearance.  HENT:     Head: Normocephalic and atraumatic.  Eyes:     General: No scleral icterus.    Conjunctiva/sclera: Conjunctivae normal.  Cardiovascular:     Rate and Rhythm: Normal rate.  Pulmonary:  Effort: Pulmonary effort is normal.  Neurological:     Mental Status: She is alert and oriented to person, place, and time. Mental status is at baseline.  Psychiatric:        Mood and Affect: Mood normal.        Behavior: Behavior normal.      No results found for any visits on 12/02/24.  Assessment & Plan    Chronic migraine without aura without status migrainosus, not intractable  Restless legs  Nicotine dependence with current use  Elevated blood-pressure reading without diagnosis of hypertension  Attention deficit  Vitamin D  deficiency -     Vitamin D  (Ergocalciferol ); Take 1 capsule (50,000 Units total)  by mouth every 7 (seven) days.  Dispense: 4 capsule; Refill: 3  Vitamin B12 deficiency  Elevated hemoglobin A1c  Severe obstructive sleep apnea Assessment & Plan: Wearing CPAP every night for a minimum of 4 hours. Deriving good benefit from it. - Continue CPAP therapy with goal of consistent nightly use.   Orders: -     Zepbound ; Inject 7.5 mg into the skin once a week.  Dispense: 2 mL; Refill: 3  Insomnia, unspecified type -     traZODone  HCl; Take 1 tablet (100 mg total) by mouth at bedtime.  Dispense: 30 tablet; Refill: 6 -     Gabapentin ; Take 1 tablet (600 mg total) by mouth at bedtime as needed.  Dispense: 30 tablet; Refill: 6     Vitamin D  deficiency Inadequate supplementation impacting fatigue and health. - Prescribed vitamin D  50,000 units once weekly. - Recheck vitamin D  levels at next visit with Doctor Franchot.  Vitamin B12 deficiency Inconsistent supplementation affecting fatigue and neurological function. - Recommended over-the-counter vitamin B12 1000 mcg daily for at least three months. - Recheck vitamin B12 levels at next visit with Doctor Franchot.  Insomnia Issues with trazodone  prescription management. - Continue trazodone  with 30-day supply as per pharmacy limitations.  Obesity Improvement with Zepbound , 14-pound weight loss, resolved nausea. - Continue Zepbound  with potential increase in dosage. ***  No follow-ups on file.      I discussed the assessment and treatment plan with the patient  The patient was provided an opportunity to ask questions and all were answered. The patient agreed with the plan and demonstrated an understanding of the instructions.   The patient was advised to call back or seek an in-person evaluation if the symptoms worsen or if the condition fails to improve as anticipated.    LAURAINE LOISE BUOY, DO  [facility] [phone] (phone) [phone] (fax)  [facility]    [1]   Outpatient Medications Prior to Visit  Medication Sig   cyanocobalamin  (VITAMIN B12) 1000 MCG tablet Take 1 tablet (1,000 mcg total) by mouth daily.   [DISCONTINUED] gabapentin  (NEURONTIN ) 600 MG tablet Take 1 tablet (600 mg total) by mouth at bedtime as needed.   [DISCONTINUED] promethazine -dextromethorphan (PROMETHAZINE -DM) 6.25-15 MG/5ML syrup Take 5 mLs by mouth 4 (four) times daily as needed for cough.   [DISCONTINUED] tirzepatide  (ZEPBOUND ) 5 MG/0.5ML Pen Inject 5 mg into the skin once a week.   [DISCONTINUED] traZODone  (DESYREL ) 100 MG tablet Take 1 tablet (100 mg total) by mouth at bedtime.   [DISCONTINUED] Vitamin D , Ergocalciferol , (DRISDOL ) 1.25 MG (50000 UNIT) CAPS capsule Take 1 capsule (50,000 Units total) by mouth every 7 (seven) days.   No facility-administered medications prior to visit.   "

## 2025-03-24 ENCOUNTER — Encounter
